# Patient Record
Sex: Female | Born: 1985 | Race: Black or African American | Hispanic: No | Marital: Single | Smoking: Never smoker
Health system: Southern US, Community
[De-identification: ages and names within clinical notes are randomized; demographics above are authoritative.]

## PROBLEM LIST (undated history)

## (undated) DIAGNOSIS — L309 Dermatitis, unspecified: Secondary | ICD-10-CM

## (undated) DIAGNOSIS — F909 Attention-deficit hyperactivity disorder, unspecified type: Secondary | ICD-10-CM

## (undated) DIAGNOSIS — M5430 Sciatica, unspecified side: Secondary | ICD-10-CM

## (undated) DIAGNOSIS — G5600 Carpal tunnel syndrome, unspecified upper limb: Secondary | ICD-10-CM

## (undated) HISTORY — DX: Dermatitis, unspecified: L30.9

## (undated) HISTORY — DX: Sciatica, unspecified side: M54.30

## (undated) HISTORY — DX: Attention-deficit hyperactivity disorder, unspecified type: F90.9

---

## 2008-07-25 HISTORY — PX: CHOLECYSTECTOMY: SHX55

## 2009-05-25 ENCOUNTER — Inpatient Hospital Stay (HOSPITAL_COMMUNITY): Admission: EM | Admit: 2009-05-25 | Discharge: 2009-05-29 | Payer: Self-pay | Admitting: Emergency Medicine

## 2010-10-27 LAB — COMPREHENSIVE METABOLIC PANEL
ALT: 131 U/L — ABNORMAL HIGH (ref 0–35)
ALT: 323 U/L — ABNORMAL HIGH (ref 0–35)
AST: 140 U/L — ABNORMAL HIGH (ref 0–37)
AST: 21 U/L (ref 0–37)
Albumin: 2.7 g/dL — ABNORMAL LOW (ref 3.5–5.2)
Albumin: 2.8 g/dL — ABNORMAL LOW (ref 3.5–5.2)
Albumin: 2.8 g/dL — ABNORMAL LOW (ref 3.5–5.2)
Albumin: 3.1 g/dL — ABNORMAL LOW (ref 3.5–5.2)
BUN: 12 mg/dL (ref 6–23)
BUN: 2 mg/dL — ABNORMAL LOW (ref 6–23)
Calcium: 7.6 mg/dL — ABNORMAL LOW (ref 8.4–10.5)
Chloride: 108 mEq/L (ref 96–112)
Chloride: 109 mEq/L (ref 96–112)
Creatinine, Ser: 0.69 mg/dL (ref 0.4–1.2)
Creatinine, Ser: 0.73 mg/dL (ref 0.4–1.2)
Creatinine, Ser: 0.84 mg/dL (ref 0.4–1.2)
Creatinine, Ser: 0.87 mg/dL (ref 0.4–1.2)
GFR calc Af Amer: >60 mL/min (ref >60)
GFR calc non Af Amer: >60 mL/min (ref >60)
GFR calc non Af Amer: >60 mL/min (ref >60)
GFR calc non Af Amer: >60 mL/min (ref >60)
Glucose, Bld: 73 mg/dL (ref 70–99)
Potassium: 3.4 mEq/L — ABNORMAL LOW (ref 3.5–5.1)
Potassium: 3.5 mEq/L (ref 3.5–5.1)
Total Bilirubin: 0.5 mg/dL (ref 0.3–1.2)
Total Bilirubin: 0.7 mg/dL (ref 0.3–1.2)
Total Bilirubin: 1.1 mg/dL (ref 0.3–1.2)
Total Protein: 6.3 g/dL (ref 6.0–8.3)
Total Protein: 6.3 g/dL (ref 6.0–8.3)

## 2010-10-27 LAB — HEPATIC FUNCTION PANEL: Albumin: 3.4 g/dL — ABNORMAL LOW (ref 3.5–5.2)

## 2010-10-27 LAB — URINALYSIS, ROUTINE W REFLEX MICROSCOPIC
Glucose, UA: NEGATIVE mg/dL
Leukocytes, UA: NEGATIVE
Specific Gravity, Urine: 1.016 (ref 1.005–1.030)
Urobilinogen, UA: 1 mg/dL (ref 0.0–1.0)

## 2010-10-27 LAB — HEPATITIS PANEL, ACUTE
Hep B C IgM: NEGATIVE
Hepatitis B Surface Ag: NEGATIVE

## 2010-10-27 LAB — DIFFERENTIAL
Basophils Absolute: 0 10*3/uL (ref 0.0–0.1)
Eosinophils Absolute: 0 10*3/uL (ref 0.0–0.7)
Eosinophils Relative: 0 % (ref 0–5)
Monocytes Absolute: 0.8 10*3/uL (ref 0.1–1.0)
Monocytes Relative: 9 % (ref 3–12)
Neutrophils Relative %: 76 % (ref 43–77)

## 2010-10-27 LAB — CBC
HCT: 34.8 % — ABNORMAL LOW (ref 36.0–46.0)
HCT: 35.8 % — ABNORMAL LOW (ref 36.0–46.0)
Hemoglobin: 11.6 g/dL — ABNORMAL LOW (ref 12.0–15.0)
Hemoglobin: 12.3 g/dL (ref 12.0–15.0)
MCHC: 34.5 g/dL (ref 30.0–36.0)
MCHC: 34.5 g/dL (ref 30.0–36.0)
MCV: 87.6 fL (ref 78.0–100.0)
MCV: 88.1 fL (ref 78.0–100.0)
Platelets: 136 10*3/uL — ABNORMAL LOW (ref 150–400)
Platelets: 152 10*3/uL (ref 150–400)
RBC: 4.07 MIL/uL (ref 3.87–5.11)
RDW: 13.6 % (ref 11.5–15.5)
WBC: 8.6 10*3/uL (ref 4.0–10.5)

## 2010-10-27 LAB — LIPID PANEL
Cholesterol: 146 mg/dL (ref 0–200)
LDL Cholesterol: 88 mg/dL (ref 0–99)
Total CHOL/HDL Ratio: 3.2 RATIO
VLDL: 13 mg/dL (ref 0–40)

## 2010-10-27 LAB — HSV(HERPES SMPLX)ABS-I+II(IGG+IGM)-BLD
Herpes Simplex Vrs I + II Ab, IgG: 34.7 IV — ABNORMAL HIGH
Herpes Simplex Vrs I&II-IgM Ab (EIA): 2.52 INDEX — ABNORMAL HIGH

## 2010-10-27 LAB — URINE MICROSCOPIC-ADD ON

## 2010-10-27 LAB — GLUCOSE, CAPILLARY: Glucose-Capillary: 98 mg/dL (ref 70–99)

## 2010-10-27 LAB — CMV ABS, IGG+IGM (CYTOMEGALOVIRUS)
CMV IgM: <8 AU/mL (ref <30.0)
Cytomegalovirus Ab-IgG: 8.3 IU/mL — ABNORMAL HIGH (ref <0.4)

## 2010-10-27 LAB — TSH: TSH: 0.228 u[IU]/mL — ABNORMAL LOW (ref 0.350–4.500)

## 2010-10-27 LAB — SEDIMENTATION RATE: Sed Rate: 35 mm/hr — ABNORMAL HIGH (ref 0–22)

## 2010-10-27 LAB — BASIC METABOLIC PANEL
Calcium: 7.9 mg/dL — ABNORMAL LOW (ref 8.4–10.5)
GFR calc non Af Amer: >60 mL/min (ref >60)
Glucose, Bld: 110 mg/dL — ABNORMAL HIGH (ref 70–99)
Sodium: 132 mEq/L — ABNORMAL LOW (ref 135–145)

## 2010-10-27 LAB — T4, FREE: Free T4: 0.79 ng/dL — ABNORMAL LOW (ref 0.80–1.80)

## 2010-10-27 LAB — EPSTEIN-BARR VIRUS VCA ANTIBODY PANEL
EBV EA IgG: 1.31 {ISR} — ABNORMAL HIGH
EBV VCA IgM: 0.11 {ISR}

## 2010-10-27 LAB — CK TOTAL AND CKMB (NOT AT ARMC): Total CK: 187 U/L — ABNORMAL HIGH (ref 7–177)

## 2010-10-27 LAB — LIPASE, BLOOD: Lipase: 306 U/L — ABNORMAL HIGH (ref 11–59)

## 2010-10-28 LAB — URINE CULTURE

## 2010-10-28 LAB — URINALYSIS, ROUTINE W REFLEX MICROSCOPIC
Nitrite: NEGATIVE
Specific Gravity, Urine: 1.026 (ref 1.005–1.030)
pH: 7 (ref 5.0–8.0)

## 2010-10-28 LAB — COMPREHENSIVE METABOLIC PANEL
ALT: 506 U/L — ABNORMAL HIGH (ref 0–35)
AST: 426 U/L — ABNORMAL HIGH (ref 0–37)
Albumin: 3.9 g/dL (ref 3.5–5.2)
CO2: 22 mEq/L (ref 19–32)
Calcium: 8.7 mg/dL (ref 8.4–10.5)
GFR calc Af Amer: >60 mL/min (ref >60)
Sodium: 135 mEq/L (ref 135–145)
Total Protein: 7.6 g/dL (ref 6.0–8.3)

## 2010-10-28 LAB — CBC
MCHC: 34.2 g/dL (ref 30.0–36.0)
Platelets: 156 10*3/uL (ref 150–400)
RBC: 4.61 MIL/uL (ref 3.87–5.11)
RDW: 13.5 % (ref 11.5–15.5)

## 2010-10-28 LAB — DIFFERENTIAL
Eosinophils Absolute: 0 10*3/uL (ref 0.0–0.7)
Eosinophils Relative: 0 % (ref 0–5)
Lymphs Abs: 1.5 10*3/uL (ref 0.7–4.0)
Monocytes Absolute: 0.7 10*3/uL (ref 0.1–1.0)
Monocytes Relative: 8 % (ref 3–12)

## 2010-10-28 LAB — URINE MICROSCOPIC-ADD ON

## 2012-05-08 ENCOUNTER — Institutional Professional Consult (permissible substitution): Payer: Medicaid Other | Admitting: Pulmonary Disease

## 2016-01-28 ENCOUNTER — Emergency Department (HOSPITAL_COMMUNITY)
Admission: EM | Admit: 2016-01-28 | Discharge: 2016-01-28 | Disposition: A | Discharge status: Home / Self Care | Payer: Medicaid Other | Attending: Emergency Medicine | Admitting: Emergency Medicine

## 2016-01-28 ENCOUNTER — Encounter (HOSPITAL_COMMUNITY): Payer: Self-pay | Admitting: Emergency Medicine

## 2016-01-28 DIAGNOSIS — M5441 Lumbago with sciatica, right side: Secondary | ICD-10-CM | POA: Insufficient documentation

## 2016-01-28 MED ORDER — IBUPROFEN 600 MG PO TABS: 600.0000 mg | ORAL_TABLET | Freq: Three times a day (TID) | ORAL | Status: DC

## 2016-01-28 MED ORDER — PREDNISONE 20 MG PO TABS: 40.0000 mg | ORAL_TABLET | Freq: Every day | ORAL | Status: DC

## 2016-01-28 MED ORDER — KETOROLAC TROMETHAMINE 60 MG/2ML IM SOLN
60.0000 mg | Freq: Once | INTRAMUSCULAR | Status: AC
  Administered 2016-01-28: 60 mg via INTRAMUSCULAR
  Filled 2016-01-28: qty 2

## 2016-01-28 MED ORDER — METHOCARBAMOL 500 MG PO TABS: 500.0000 mg | ORAL_TABLET | Freq: Every evening | ORAL | Status: DC | PRN

## 2016-01-28 NOTE — ED Notes (Signed)
Low back pain, radiates into left buttock area, hurts to sit, move. Took niece and nephew to pool on 4th and 5th and was playing with them, pain got worse over night.

## 2016-01-28 NOTE — ED Provider Notes (Signed)
CSN: [id number]     Arrival date & time 01/28/16  1422 History  By signing my name below, I, Vanessa Bennett, attest that this documentation has been prepared under the direction and in the presence of non-physician practitioner, Terance HartKelly Robertson Colclough, PA-C. Electronically Signed: Freida Busmaniana Bennett, Scribe. 01/28/2016. 3:19 PM.    Chief Complaint  Patient presents with  . Back Pain    The history is provided by the patient. No language interpreter was used.    HPI Comments:  Vanessa Bennett is a 30 y.o. female with no significant PMH who presents to the Emergency Department complaining of gradual onset, moderate, mid to lower back pain which began ~1600 yesterday. Her pain is exacerbated with movement. She notes radiation of pain into left buttocks. She denies recent injury/fall.  She has taken tylenol without relief. She notes a hot shower and massage makes it better. She denies abdominal pain and neck pain, fever, bowel/bladder incontinence, numbness to groin area and h/o IVDA.   History reviewed. No pertinent past medical history. Past Surgical History  Procedure Laterality Date  . Cholecystectomy  2010   No family history on file. Social History  Substance Use Topics  . Smoking status: Never Smoker   . Smokeless tobacco: None  . Alcohol Use: No   OB History    No data available     Review of Systems  Gastrointestinal: Negative for abdominal pain.  Musculoskeletal: Positive for back pain. Negative for neck pain.  Neurological: Negative for weakness and numbness.   Allergies  Review of patient's allergies indicates not on file.  Home Medications   Prior to Admission medications   Not on File   BP 137/90 mmHg  Pulse 71  Temp(Src) 98 F (36.7 C) (Oral)  Resp 20  SpO2 99%  LMP 01/08/2016 (Exact Date)   Physical Exam  Constitutional: She is oriented to person, place, and time. She appears well-developed and well-nourished. No distress.  Obese  HENT:  Head: Normocephalic and  atraumatic.  Eyes: Conjunctivae are normal. Pupils are equal, round, and reactive to light. Right eye exhibits no discharge. Left eye exhibits no discharge. No scleral icterus.  Neck: Normal range of motion.  Cardiovascular: Normal rate.   Pulmonary/Chest: Effort normal. No respiratory distress.  Abdominal: She exhibits no distension.  Musculoskeletal:  Inspection: No masses, deformity, or rash Palpation: No midline spinal tenderness. Right lumbar paraspinal muscle tenderness. ROM: Deferred due to pain Strength: 5/5 in lower extremities and normal plantar and dorsiflexion Sensation: Intact sensation with light touch in lower extremities bilaterally Gait: Antalgic gait Reflexes: Patellar reflex is 2+ bilaterally, Achilles is 2+ bilaterally SLR: Positive seated straight leg raise on right side  Neurological: She is alert and oriented to person, place, and time.  Skin: Skin is warm and dry.  Psychiatric: She has a normal mood and affect. Her behavior is normal.    ED Course  Procedures  DIAGNOSTIC STUDIES:  Oxygen Saturation is 99% on RA, normal by my interpretation.    COORDINATION OF CARE:  3:13 PM Will discharge with steroids, anti-inflammatories, and muscle relaxer. Discussed treatment plan with pt at bedside and pt agreed to plan.   MDM   Final diagnoses:  Right-sided low back pain with right-sided sciatica   Patient with back pain.  No neurological deficits and normal neuro exam.  Patient is ambulatory.  No loss of bowel or bladder control.  No concern for cauda equina.  No fever, night sweats, weight loss, h/o cancer, IVDA, no recent procedure  to back. No urinary symptoms suggestive of UTI.  Rx for motrin, robaxin, and prednisone given. Supportive care and return precaution discussed. Appears safe for discharge at this time. Follow up as indicated in discharge paperwork.   I personally performed the services described in this documentation, which was scribed in my presence.  The recorded information has been reviewed and is accurate.    Vanessa BornKelly Marie Stevey Stapleton, PA-C 01/29/16 [id number]  Vanessa HutchingBrian Cook, MD 01/29/16 [phone]

## 2018-01-29 ENCOUNTER — Emergency Department (HOSPITAL_COMMUNITY): Payer: No Typology Code available for payment source

## 2018-01-29 ENCOUNTER — Other Ambulatory Visit: Payer: Self-pay

## 2018-01-29 ENCOUNTER — Encounter (HOSPITAL_COMMUNITY): Payer: Self-pay

## 2018-01-29 ENCOUNTER — Emergency Department (HOSPITAL_COMMUNITY)
Admission: EM | Admit: 2018-01-29 | Discharge: 2018-01-29 | Disposition: A | Discharge status: Home / Self Care | Payer: No Typology Code available for payment source | Attending: Emergency Medicine | Admitting: Emergency Medicine

## 2018-01-29 DIAGNOSIS — Y998 Other external cause status: Secondary | ICD-10-CM | POA: Diagnosis not present

## 2018-01-29 DIAGNOSIS — Y9389 Activity, other specified: Secondary | ICD-10-CM | POA: Insufficient documentation

## 2018-01-29 DIAGNOSIS — Y92410 Unspecified street and highway as the place of occurrence of the external cause: Secondary | ICD-10-CM | POA: Insufficient documentation

## 2018-01-29 DIAGNOSIS — F1210 Cannabis abuse, uncomplicated: Secondary | ICD-10-CM | POA: Diagnosis not present

## 2018-01-29 DIAGNOSIS — M545 Low back pain: Secondary | ICD-10-CM | POA: Diagnosis present

## 2018-01-29 HISTORY — DX: Carpal tunnel syndrome, unspecified upper limb: G56.00

## 2018-01-29 MED ORDER — NAPROXEN 375 MG PO TABS: 375.0000 mg | ORAL_TABLET | Freq: Two times a day (BID) | ORAL | 0 refills | Status: DC

## 2018-01-29 MED ORDER — METHOCARBAMOL 500 MG PO TABS: 500.0000 mg | ORAL_TABLET | Freq: Three times a day (TID) | ORAL | 0 refills | Status: DC | PRN

## 2018-01-29 MED ORDER — ACETAMINOPHEN 325 MG PO TABS
650.0000 mg | ORAL_TABLET | Freq: Once | ORAL | Status: AC
  Administered 2018-01-29: 650 mg via ORAL
  Filled 2018-01-29: qty 2
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
## 2018-01-29 NOTE — ED Notes (Signed)
Patient transported to X-ray 
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
## 2022-08-23 ENCOUNTER — Encounter (HOSPITAL_BASED_OUTPATIENT_CLINIC_OR_DEPARTMENT_OTHER): Payer: Self-pay | Admitting: Emergency Medicine

## 2022-08-23 ENCOUNTER — Emergency Department (HOSPITAL_BASED_OUTPATIENT_CLINIC_OR_DEPARTMENT_OTHER)
Admission: EM | Admit: 2022-08-23 | Discharge: 2022-08-23 | Disposition: A | Discharge status: Home / Self Care | Payer: 59 | Attending: Emergency Medicine | Admitting: Emergency Medicine

## 2022-08-23 ENCOUNTER — Other Ambulatory Visit: Payer: Self-pay

## 2022-08-23 DIAGNOSIS — M5441 Lumbago with sciatica, right side: Secondary | ICD-10-CM | POA: Diagnosis not present

## 2022-08-23 DIAGNOSIS — M545 Low back pain, unspecified: Secondary | ICD-10-CM | POA: Diagnosis present

## 2022-08-23 DIAGNOSIS — M5431 Sciatica, right side: Secondary | ICD-10-CM

## 2022-08-23 MED ORDER — PREDNISONE 20 MG PO TABS: 60.0000 mg | ORAL_TABLET | Freq: Every day | ORAL | 0 refills | Status: AC

## 2022-08-23 NOTE — ED Triage Notes (Signed)
Pt arrives to ED with c/o lower back pain with radiation down right leg x2 weeks.

## 2022-08-23 NOTE — Discharge Instructions (Addendum)
Take Tylenol every 4 hours needed for pain in addition to ice or heat. Take steroids as directed for 5 days.  I would avoid ibuprofen during this time as it is hard on your stomach. Return if you develop weakness, fevers, incontinence or new concerns. Follow-up with primary doctor and/or sports medicine as you may need further workup or testing.  Work note provided.

## 2022-08-23 NOTE — ED Provider Notes (Signed)
[facility] Provider Note   CSN: [id number] Arrival date & time: 08/23/22  3536     History  Chief Complaint  Patient presents with   Back Pain    Vanessa Bennett is a 37 y.o. female.  Patient's had intermittent gradually worsening pain down the right leg worse with position and movement.  This been going on for almost 2 weeks.  Patient denies any bowel or bladder change, weakness or numbness.  Difficulty with walking due to pain.  Patient denies any blood clot history, no leg swelling, starts in the right upper buttock area and goes down the back and then side of the right leg.  No fevers or chills or injury.  Patient tried muscle relaxant without significant improvement.  Patient does not have primary doctor but is currently try to get 1.       Home Medications Prior to Admission medications   Medication Sig Start Date End Date Taking? Authorizing Provider  predniSONE (DELTASONE) 20 MG tablet Take 3 tablets (60 mg total) by mouth daily with breakfast for 5 days. 08/23/22 08/28/22 Yes Elnora Morrison, MD  methocarbamol (ROBAXIN) 500 MG tablet Take 1 tablet (500 mg total) by mouth every 8 (eight) hours as needed. 01/29/18   Petrucelli, Samantha R, PA-C  naproxen (NAPROSYN) 375 MG tablet Take 1 tablet (375 mg total) by mouth 2 (two) times daily. 01/29/18   Petrucelli, Samantha R, PA-C      Allergies    Penicillins    Review of Systems   Review of Systems  Constitutional:  Negative for chills and fever.  HENT:  Negative for congestion.   Eyes:  Negative for visual disturbance.  Respiratory:  Negative for shortness of breath.   Cardiovascular:  Negative for chest pain.  Gastrointestinal:  Negative for abdominal pain and vomiting.  Genitourinary:  Negative for dysuria and flank pain.  Musculoskeletal:  Positive for back pain and gait problem. Negative for neck pain and neck stiffness.  Skin:  Negative for rash.  Neurological:  Negative for  light-headedness and headaches.    Physical Exam Updated Vital Signs Ht 5\' 7"  (1.702 m)   Wt 105.4 kg   BMI 36.39 kg/m  Physical Exam Vitals and nursing note reviewed.  Constitutional:      General: She is not in acute distress.    Appearance: She is well-developed.  HENT:     Head: Normocephalic and atraumatic.     Mouth/Throat:     Mouth: Mucous membranes are moist.  Eyes:     General:        Right eye: No discharge.        Left eye: No discharge.     Conjunctiva/sclera: Conjunctivae normal.  Neck:     Trachea: No tracheal deviation.  Cardiovascular:     Rate and Rhythm: Normal rate.     Heart sounds: No murmur heard. Pulmonary:     Effort: Pulmonary effort is normal.     Breath sounds: Normal breath sounds.  Abdominal:     General: There is no distension.     Palpations: Abdomen is soft.     Tenderness: There is no abdominal tenderness. There is no guarding.  Musculoskeletal:        General: Tenderness present. No swelling.     Cervical back: Normal range of motion.     Comments: Patient has normal strength of flexion extension of hip knee and ankles.  No foot drop.  Neurovascular intact right  leg no edema.  Pain with flexion of the hip and specific movements with walking cautious gait.  Skin:    General: Skin is warm.     Capillary Refill: Capillary refill takes less than 2 seconds.     Findings: No rash.  Neurological:     General: No focal deficit present.     Mental Status: She is alert.     Sensory: No sensory deficit.     Motor: No weakness.  Psychiatric:        Mood and Affect: Mood normal.     ED Results / Procedures / Treatments   Labs (all labs ordered are listed, but only abnormal results are displayed) Labs Reviewed - No data to display  EKG None  Radiology No results found.  Procedures Procedures    Medications Ordered in ED Medications - No data to display  ED Course/ Medical Decision Making/ A&P                              Medical Decision Making Risk Prescription drug management.   Patient presents with clinical concern for sciatica on the right.  No neurologic deficits and pain mild to moderate at this time.  Discussed importance of primary doctor, sports medicine follow-up and strict reasons to return such as weakness or bowel or bladder changes. No concern for infectious etiology at this time.  Vital signs pending and requested.  Prednisone for 5 days and outpatient follow-up.        Final Clinical Impression(s) / ED Diagnoses Final diagnoses:  Sciatica of right side    Rx / DC Orders ED Discharge Orders          Ordered    predniSONE (DELTASONE) 20 MG tablet  Daily with breakfast        08/23/22 1100              Elnora Morrison, MD 08/23/22 1103

## 2022-08-31 ENCOUNTER — Ambulatory Visit (INDEPENDENT_AMBULATORY_CARE_PROVIDER_SITE_OTHER): Payer: 59 | Admitting: Sports Medicine

## 2022-08-31 VITALS — BP 132/88 | Ht 67.0 in | Wt 232.0 lb

## 2022-08-31 DIAGNOSIS — M5441 Lumbago with sciatica, right side: Secondary | ICD-10-CM

## 2022-08-31 MED ORDER — NAPROXEN 375 MG PO TABS: 375.0000 mg | ORAL_TABLET | Freq: Two times a day (BID) | ORAL | 0 refills | Status: DC

## 2022-08-31 NOTE — Progress Notes (Signed)
   New Patient Office Visit  Subjective   Patient ID: Vanessa Bennett, female    DOB: Dec 09, 1985  Age: 37 y.o. MRN: [id number]  Low back pain with radiation down leg.  Ms. Bloodsaw is here today for ER follow-up for back pain.  She was seen about 1 week ago in the ER with chief complaint of back pain that been bothering her for about 2 weeks and was radiating down her leg.  She has had back pain in the past however not quite as severe as she was having it last week.  At that time she was diagnosed with sciatica and prescribed 5-day prednisone course.  She reports she is much better after taking the steroid as she was having difficulty ambulating at the time of her ER evaluation. She denies any numbness or tingling radiating down her leg, weakness or loss of bowel or bladder function.  She works a very active job at [employer].   ROS as listed above in HPI    Objective:     BP 132/88   Ht 5\' 7"  (1.702 m)   Wt 232 lb (105.2 kg)   BMI 36.34 kg/m   Physical Exam Vitals reviewed.  Constitutional:      General: She is not in acute distress.    Appearance: Normal appearance. She is obese. She is not ill-appearing, toxic-appearing or diaphoretic.  Pulmonary:     Effort: Pulmonary effort is normal.  Neurological:     Mental Status: She is alert.   Seated comfortably in exam room.  Low back: No obvious deformity or asymmetry. Tenderness to palpation R glute muscles and PSIS.  No tenderness to palpation over her greater trochanter.  Negative seated straight leg raise testing bilaterally. Sensation to light touch intact.  Strength 5/5 resisted knee and hip flexion. Resisted plantarflexion dorsiflexion of the bilateral ankles 5/5.  Reflexes patellar and Achilles 2/4 bilaterally. Normal gait.    Assessment & Plan:   Problem List Items Addressed This Visit       Nervous and Auditory   Acute right-sided low back pain with right-sided sciatica - Primary    Patient was seen in the ER and given a  5-day prednisone course with some improvement in her symptoms.  She was also recommended follow-up with sports medicine and primary care provider. Symptoms today most concerning for sciatica.  Will send her to some formal physical therapy for couple of visits and then transition to home program.  I have also sent to her pharmacy naproxen, she has done well with this medication in the past for her to use for the next 7 to 10 days until her symptoms have resolved.  If she has no improvement with physical therapy we can consider further imaging, MRI, however anticipate she will do very well. Follow-up if no improvement of her symptoms after 6 weeks.      Relevant Medications   naproxen (NAPROSYN) 375 MG tablet    Return if symptoms worsen or fail to improve.    Elmore Guise, DO  Addendum:  I was the preceptor for this visit and available for immediate consultation.  Karlton Lemon MD Kirt Boys

## 2022-08-31 NOTE — Assessment & Plan Note (Addendum)
Patient was seen in the ER and given a 5-day prednisone course with some improvement in her symptoms.  She was also recommended follow-up with sports medicine and primary care provider. Symptoms today most concerning for sciatica.  Will send her to some formal physical therapy for couple of visits and then transition to home program.  I have also sent to her pharmacy naproxen, she has done well with this medication in the past for her to use for the next 7 to 10 days until her symptoms have resolved.  If she has no improvement with physical therapy we can consider further imaging, MRI, however anticipate she will do very well. Follow-up if no improvement of her symptoms after 6 weeks.

## 2022-08-31 NOTE — Patient Instructions (Signed)
Your symptoms today are most consistent with sciatica.  I recommend couple sessions of physical therapy and transition to home program.  Room to do these exercises until you are pain-free and then as a maintenance program a few times a week.  While you are at physical therapy I recommend you discuss with them appropriate lifting mechanics as you do this frequently at work.  I have also sent to your pharmacy some naproxen to use for the next 7 to 10 days.  You may then transition to using the naproxen as needed.  Follow-up with [place] in 6 weeks if your symptoms have not resolved or they have worsened.

## 2022-09-12 ENCOUNTER — Ambulatory Visit: Payer: 59 | Admitting: Student

## 2022-09-12 ENCOUNTER — Other Ambulatory Visit: Payer: Self-pay

## 2022-09-12 ENCOUNTER — Encounter: Payer: Self-pay | Admitting: Student

## 2022-09-12 VITALS — BP 111/85 | HR 79 | Temp 98.5°F | Resp 24 | Ht 67.0 in | Wt 229.5 lb

## 2022-09-12 DIAGNOSIS — Z0000 Encounter for general adult medical examination without abnormal findings: Secondary | ICD-10-CM

## 2022-09-12 DIAGNOSIS — R946 Abnormal results of thyroid function studies: Secondary | ICD-10-CM | POA: Diagnosis not present

## 2022-09-12 DIAGNOSIS — Z87898 Personal history of other specified conditions: Secondary | ICD-10-CM | POA: Diagnosis not present

## 2022-09-12 NOTE — Patient Instructions (Signed)
Thank you so much for coming to the clinic today!   Welcome to the clinic! We are checking your thyroid tests today as well as your sugar level. If there is anything abnormal I will give you a call. If y ou need anything else please call the clinic whenever!  If you have any questions please feel free to the call the clinic at anytime at [phone]. It was a pleasure seeing you!  Best, Dr. Sanjuana Mae

## 2022-09-13 LAB — TSH: TSH: 0.426 u[IU]/mL — ABNORMAL LOW (ref 0.450–4.500)

## 2022-09-13 LAB — HEMOGLOBIN A1C
Est. average glucose Bld gHb Est-mCnc: 131 mg/dL
Hgb A1c MFr Bld: 6.2 % — ABNORMAL HIGH (ref 4.8–5.6)

## 2022-09-13 LAB — HIV ANTIBODY (ROUTINE TESTING W REFLEX): HIV Screen 4th Generation wRfx: NONREACTIVE

## 2022-09-13 NOTE — Progress Notes (Addendum)
CC: New patient establishing  HPI:  Ms.Vanessa Bennett is a 37 y.o. female living with a history stated below and presents today for new patient establishing. Please see problem based assessment and plan for additional details.  Past Medical History:  Diagnosis Date   ADHD    Carpal tunnel syndrome    Eczema    Sciatica     Current Outpatient Medications on File Prior to Visit  Medication Sig Dispense Refill   cetirizine (ZYRTEC) 5 MG chewable tablet Chew 5 mg by mouth daily.     No current facility-administered medications on file prior to visit.    Family History  Problem Relation Age of Onset   Sleep apnea Mother    Hypertension Father     Social History   Socioeconomic History   Marital status: Single    Spouse name: Not on file   Number of children: Not on file   Years of education: Not on file   Highest education level: Not on file  Occupational History   Occupation: Scientific laboratory technician  Tobacco Use   Smoking status: Never   Smokeless tobacco: Not on file   Tobacco comments:    Smokes Marijuana   Substance and Sexual Activity   Alcohol use: Yes    Comment: rare   Drug use: Yes    Types: Marijuana    Comment: Every now and then   Sexual activity: Not Currently  Other Topics Concern   Not on file  Social History Narrative   Not on file   Social Determinants of Health   Financial Resource Strain: Not on file  Food Insecurity: No Food Insecurity (09/12/2022)   Hunger Vital Sign    Worried About Running Out of Food in the Last Year: Never true    Ran Out of Food in the Last Year: Never true  Transportation Needs: No Transportation Needs (09/12/2022)   PRAPARE - Hydrologist (Medical): No    Lack of Transportation (Non-Medical): No  Physical Activity: Not on file  Stress: Not on file  Social Connections: Moderately Isolated (09/12/2022)   Social Connection and Isolation Panel [NHANES]    Frequency of Communication with Friends  and Family: More than three times a week    Frequency of Social Gatherings with Friends and Family: More than three times a week    Attends Religious Services: 1 to 4 times per year    Active Member of [employer] or Organizations: No    Attends Archivist Meetings: Never    Marital Status: Never married  Intimate Partner Violence: Not At Risk (09/12/2022)   Humiliation, Afraid, Rape, and Kick questionnaire    Fear of Current or Ex-Partner: No    Emotionally Abused: No    Physically Abused: No    Sexually Abused: No    Review of Systems: ROS negative except for what is noted on the assessment and plan.  Vitals:   09/12/22 1026  BP: 111/85  Pulse: 79  Resp: (!) 24  Temp: 98.5 F (36.9 C)  TempSrc: Oral  SpO2: 100%  Weight: 229 lb 8 oz (104.1 kg)  Height: 5' 7"$  (1.702 m)    Physical Exam: Constitutional: Obese woman,  in no acute distress HENT: normocephalic atraumatic, mucous membranes moist Eyes: conjunctiva non-erythematous Neck: supple Cardiovascular: regular rate and rhythm, no m/r/g Pulmonary/Chest: normal work of breathing on room air, lungs clear to auscultation bilaterally Abdominal: soft, non-tender, non-distended MSK: normal bulk and tone  Neurological: alert & oriented x 3, 5/5 strength in bilateral upper and lower extremities, normal gait Skin: warm and dry Psych: Normal mood and affect  Assessment & Plan:   History of prediabetes Patient states that her previous primary care provider, she was at risk for prediabetes.  She denies any polyuria or polydipsia.  She has never taken medications for glycemic control before.  Will obtain an A1c to further evaluate.   Addendum: A1c at 6.2, encouraged patient to continue diet and exercise  Thyroid function test abnormal Patient reports previous primary care provider says she has some sort of abnormal thyroid levels.  Unable to palpate any masses on thyroid exam, and patient denies any symptoms of hyper or  hypothyroidism.  Will obtain TSH for further evaluation if necessary for intervention.  Healthcare maintenance Patient states her last Pap smear was last year no acute complaints at this moment.  She denied flu shot and Tdap vaccine.  She does say that she is COVID vaccinated when the initial COVID vaccines came out, however has not had a booster.  Patient has had hep C screening before which was negative, but has never had HIV screening, will test today.  Patient discussed with Dr. Thomasene Ripple, M.D. [facility], PGY-1 Phone: [phone] Date 09/13/2022 Time 4:27 PM

## 2022-09-13 NOTE — Assessment & Plan Note (Signed)
Patient reports previous primary care provider says she has some sort of abnormal thyroid levels.  Unable to palpate any masses on thyroid exam, and patient denies any symptoms of hyper or hypothyroidism.  Will obtain TSH for further evaluation if necessary for intervention.

## 2022-09-13 NOTE — Assessment & Plan Note (Addendum)
Patient states that her previous primary care provider, she was at risk for prediabetes.  She denies any polyuria or polydipsia.  She has never taken medications for glycemic control before.  Will obtain an A1c to further evaluate.   Addendum: A1c at 6.2, encouraged patient to continue diet and exercise

## 2022-09-13 NOTE — Assessment & Plan Note (Addendum)
Patient states her last Pap smear was last year no acute complaints at this moment.  She denied flu shot and Tdap vaccine.  She does say that she is COVID vaccinated when the initial COVID vaccines came out, however has not had a booster.  Patient has had hep C screening before which was negative, but has never had HIV screening, will test today.

## 2022-09-16 NOTE — Progress Notes (Signed)
Internal Medicine Clinic Attending  Case discussed with the resident at the time of the visit.  We reviewed the resident's history and exam and pertinent patient test results.  I agree with the assessment, diagnosis, and plan of care documented in the resident's note.  

## 2024-02-26 ENCOUNTER — Other Ambulatory Visit: Payer: Self-pay

## 2024-02-26 ENCOUNTER — Emergency Department (HOSPITAL_BASED_OUTPATIENT_CLINIC_OR_DEPARTMENT_OTHER)
Admission: EM | Admit: 2024-02-26 | Discharge: 2024-02-26 | Disposition: A | Discharge status: Home / Self Care | Attending: Emergency Medicine | Admitting: Emergency Medicine

## 2024-02-26 DIAGNOSIS — M545 Low back pain, unspecified: Secondary | ICD-10-CM | POA: Diagnosis present

## 2024-02-26 DIAGNOSIS — M5441 Lumbago with sciatica, right side: Secondary | ICD-10-CM | POA: Diagnosis not present

## 2024-02-26 DIAGNOSIS — M5431 Sciatica, right side: Secondary | ICD-10-CM

## 2024-02-26 MED ORDER — NAPROXEN 500 MG PO TABS: 500.0000 mg | ORAL_TABLET | Freq: Two times a day (BID) | ORAL | 0 refills | Status: AC | PRN

## 2024-02-26 MED ORDER — METHYLPREDNISOLONE SODIUM SUCC 40 MG IJ SOLR
40.0000 mg | Freq: Once | INTRAMUSCULAR | Status: AC
  Administered 2024-02-26: 40 mg via INTRAMUSCULAR
  Filled 2024-02-26: qty 1

## 2024-02-26 MED ORDER — PREDNISONE 50 MG PO TABS: 50.0000 mg | ORAL_TABLET | Freq: Every day | ORAL | 0 refills | Status: AC

## 2024-02-26 NOTE — ED Notes (Signed)
 Pt given discharge instructions and reviewed prescriptions. Opportunities given for questions. Pt verbalizes understanding. Jillyn Hidden, RN

## 2024-02-26 NOTE — ED Triage Notes (Signed)
 C/o lower back pain that rads down left leg. Hx of sciatica.

## 2024-02-26 NOTE — Discharge Instructions (Addendum)
 Please take 50 mg of prednisone  daily for the next 5 days  After completing this course, you can use naproxen  twice daily as needed for pain  I recommend following up with your primary care doctor and/or sports medicine doctor. You may benefit from additional physical therapy. See attached information about some exercises you can do at home.  Please seek medical attention if you have any fevers, numbness or weakness in your legs, loss of control of your bowel or bladder, or loss of sensation

## 2024-02-26 NOTE — ED Provider Notes (Addendum)
 [place] EMERGENCY DEPARTMENT AT [facility] Provider Note   CSN: [id number] Arrival date & time: 02/26/24  9189     Patient presents with: Back Pain   Vanessa Bennett is a 38 y.o. female.   Low back pain, radiating into R leg Ongoing for a couple of weeks but worsening over the past 4-5 days This morning, was having trouble standing up and walking due to the pain Denies any numbness/weakness in her extremities, saddle anesthesia, bowel/bladder incontinence, fevers. Denies hx of back surgery Denies hx of injuries, car accidents, heavy lifting   Back Pain Associated symptoms: no abdominal pain, no chest pain, no dysuria, no fever, no headaches, no numbness and no weakness        Prior to Admission medications   Medication Sig Start Date End Date Taking? Authorizing Provider  cetirizine (ZYRTEC) 5 MG chewable tablet Chew 5 mg by mouth daily.    [provider]  naproxen  (NAPROSYN ) 500 MG tablet Take 1 tablet (500 mg total) by mouth 2 (two) times daily as needed (pain). 02/26/24  Yes Romelle Booty, MD  predniSONE  (DELTASONE ) 50 MG tablet Take 1 tablet (50 mg total) by mouth daily with breakfast. For 5 days 02/26/24  Yes Romelle Booty, MD    Allergies: Penicillins    Review of Systems  Constitutional:  Negative for fever.  Respiratory:  Negative for shortness of breath.   Cardiovascular:  Negative for chest pain and palpitations.  Gastrointestinal:  Negative for abdominal pain.  Genitourinary:  Negative for difficulty urinating and dysuria.  Musculoskeletal:  Positive for back pain. Negative for neck pain.  Neurological:  Negative for dizziness, weakness, numbness and headaches.    Updated Vital Signs BP (!) 132/93 (BP Location: Right Arm)   Pulse 84   Temp 98.9 F (37.2 C) (Oral)   Resp 18   SpO2 99%   Physical Exam Constitutional:      General: She is not in acute distress.    Appearance: Normal appearance.  HENT:     Head: Normocephalic and  atraumatic.  Cardiovascular:     Rate and Rhythm: Normal rate and regular rhythm.     Heart sounds: Normal heart sounds. No murmur heard. Pulmonary:     Effort: Pulmonary effort is normal. No respiratory distress.     Breath sounds: Normal breath sounds.  Abdominal:     General: There is no distension.     Palpations: Abdomen is soft.     Tenderness: There is no abdominal tenderness.  Musculoskeletal:     Cervical back: Neck supple. No rigidity or tenderness.     Comments: No gross deformity, ecchymosis, swelling Mildly tender to palpation over right lower lumbar paraspinal muscles.  No significant bony tenderness over lumbar spine.  Nontender throughout RLE ROM of RLE restricted due to pain but has FROM with assistance Positive straight leg test on right with shooting pain along the back of her leg into the bottom of right foot 5/5 strength in RLE with normal sensation  Skin:    Findings: No rash.  Neurological:     General: No focal deficit present.     Mental Status: She is alert. Mental status is at baseline.     Sensory: No sensory deficit.     Motor: No weakness.     (all labs ordered are listed, but only abnormal results are displayed) Labs Reviewed - No data to display  EKG: None  Radiology: No results found.   Procedures   Medications  Ordered in the ED  methylPREDNISolone  sodium succinate (SOLU-MEDROL ) 40 mg/mL injection 40 mg (has no administration in time range)                                    Medical Decision Making 38 year old female with history of right-sided sciatica presenting due to radicular pain likely 2/2 flare of sciatica No red flags on history or exam to suggest acute spinal cord compression or any infectious etiology Patient was previously seen for this around a year and a half ago and her symptoms improved significantly with oral steroids  Given solumedrol IM injection today Treat with prednisone  50 mg daily x 5 days starting  tomorrow Naproxen  as needed after completing steroid course Recommend follow-up with PCP and/or sports medicine Discussed return precautions and provided handout with rehab exercises  Risk Prescription drug management.        Final diagnoses:  Sciatica of right side    ED Discharge Orders          Ordered    predniSONE  (DELTASONE ) 50 MG tablet  Daily with breakfast        02/26/24 0910    naproxen  (NAPROSYN ) 500 MG tablet  2 times daily PRN        02/26/24 0910               Romelle Booty, MD 02/26/24 9087    Romelle Booty, MD 02/26/24 9064    Romelle Booty, MD 02/26/24 9064    Doretha Folks, MD 02/26/24 [phone]

## 2024-02-29 ENCOUNTER — Other Ambulatory Visit (HOSPITAL_BASED_OUTPATIENT_CLINIC_OR_DEPARTMENT_OTHER): Payer: Self-pay

## 2024-02-29 ENCOUNTER — Emergency Department (HOSPITAL_BASED_OUTPATIENT_CLINIC_OR_DEPARTMENT_OTHER)
Admission: EM | Admit: 2024-02-29 | Discharge: 2024-02-29 | Disposition: A | Discharge status: Home / Self Care | Attending: Emergency Medicine | Admitting: Emergency Medicine

## 2024-02-29 ENCOUNTER — Other Ambulatory Visit: Payer: Self-pay

## 2024-02-29 ENCOUNTER — Emergency Department (HOSPITAL_BASED_OUTPATIENT_CLINIC_OR_DEPARTMENT_OTHER): Admitting: Radiology

## 2024-02-29 DIAGNOSIS — M5441 Lumbago with sciatica, right side: Secondary | ICD-10-CM | POA: Insufficient documentation

## 2024-02-29 LAB — PREGNANCY, URINE: Preg Test, Ur: NEGATIVE

## 2024-02-29 MED ORDER — HYDROCODONE-ACETAMINOPHEN 5-325 MG PO TABS: 1.0000 | ORAL_TABLET | Freq: Four times a day (QID) | ORAL | 0 refills | Status: DC | PRN

## 2024-02-29 MED ORDER — HYDROCODONE-ACETAMINOPHEN 5-325 MG PO TABS
1.0000 | ORAL_TABLET | Freq: Four times a day (QID) | ORAL | 0 refills | Status: AC | PRN
  Filled 2024-02-29: qty 20, 5d supply, fill #0

## 2024-02-29 MED ORDER — ONDANSETRON 4 MG PO TBDP
4.0000 mg | ORAL_TABLET | Freq: Once | ORAL | Status: AC
  Administered 2024-02-29: 4 mg via ORAL
  Filled 2024-02-29: qty 1

## 2024-02-29 MED ORDER — ONDANSETRON 4 MG PO TBDP
4.0000 mg | ORAL_TABLET | Freq: Three times a day (TID) | ORAL | 0 refills | Status: AC | PRN
  Filled 2024-02-29: qty 20, 7d supply, fill #0

## 2024-02-29 MED ORDER — HYDROMORPHONE HCL 1 MG/ML IJ SOLN
2.0000 mg | Freq: Once | INTRAMUSCULAR | Status: AC
  Administered 2024-02-29: 2 mg via INTRAMUSCULAR
  Filled 2024-02-29: qty 2

## 2024-02-29 NOTE — ED Notes (Signed)
 Patient transported to X-ray

## 2024-02-29 NOTE — ED Provider Notes (Addendum)
 [place] EMERGENCY DEPARTMENT AT [facility] Provider Note   CSN: [id number] Arrival date & time: 02/29/24  1327     Patient presents with: Leg Pain   Vanessa Bennett is a 38 y.o. female.   Was a complaint of right sided back pain for little bit over 2 weeks.  Patient seen August 4 year for similar problem started on steroids not helping.  Pain radiates down towards the right ankle.  No fall or injury.  Patient several years ago had similar problem was on steroids and it got better.  Patient just started a new job that requires a lot of movement.  Patient has established a primary care doctor but they cannot see her until end of August.  Patient did not follow-up with sports medicine orthopedics or neurosurgery.  Denies any numbness to the top or bottom of the foot.  Or any weakness.  But the pain is very debilitating.  Past medical history significant for the past history of sciatica ADHD eczema.  Patient's had her gallbladder removed.  Patient is never used tobacco products.  No involvement of the left leg.  No incontinence problems.       Prior to Admission medications   Medication Sig Start Date End Date Taking? Authorizing Provider  cetirizine (ZYRTEC) 5 MG chewable tablet Chew 5 mg by mouth daily.    [provider]  naproxen  (NAPROSYN ) 500 MG tablet Take 1 tablet (500 mg total) by mouth 2 (two) times daily as needed (pain). 02/26/24   Romelle Booty, MD  predniSONE  (DELTASONE ) 50 MG tablet Take 1 tablet (50 mg total) by mouth daily with breakfast. For 5 days 02/26/24   Romelle Booty, MD    Allergies: Penicillins    Review of Systems  Constitutional:  Negative for chills and fever.  HENT:  Negative for ear pain and sore throat.   Eyes:  Negative for pain and visual disturbance.  Respiratory:  Negative for cough and shortness of breath.   Cardiovascular:  Negative for chest pain and palpitations.  Gastrointestinal:  Negative for abdominal pain and vomiting.   Genitourinary:  Negative for difficulty urinating, dysuria and hematuria.  Musculoskeletal:  Positive for back pain. Negative for arthralgias.  Skin:  Negative for color change and rash.  Neurological:  Negative for seizures, syncope, weakness and numbness.  All other systems reviewed and are negative.   Updated Vital Signs BP (!) 151/101 (BP Location: Left Arm)   Pulse 83   Temp 98.3 F (36.8 C)   Resp 18   Ht 1.702 m (5' 7)   Wt 99.2 kg   LMP 02/21/2024 (Approximate)   SpO2 100%   BMI 34.24 kg/m   Physical Exam Vitals and nursing note reviewed.  Constitutional:      General: She is not in acute distress.    Appearance: Normal appearance. She is well-developed.  HENT:     Head: Normocephalic and atraumatic.  Eyes:     Extraocular Movements: Extraocular movements intact.     Conjunctiva/sclera: Conjunctivae normal.     Pupils: Pupils are equal, round, and reactive to light.  Cardiovascular:     Rate and Rhythm: Normal rate and regular rhythm.     Heart sounds: No murmur heard. Pulmonary:     Effort: Pulmonary effort is normal. No respiratory distress.     Breath sounds: Normal breath sounds.  Abdominal:     Palpations: Abdomen is soft.     Tenderness: There is no abdominal tenderness.  Musculoskeletal:  General: No swelling.     Cervical back: Normal range of motion and neck supple.     Right lower leg: No edema.     Left lower leg: No edema.     Comments: Mild tenderness palpation kind of right side of lumbar spine.  Right lower extremity neurovascularly intact.  No numbness no weakness.  Is associated with pain with movement.  Skin:    General: Skin is warm and dry.     Capillary Refill: Capillary refill takes less than 2 seconds.  Neurological:     General: No focal deficit present.     Mental Status: She is alert and oriented to person, place, and time.     Cranial Nerves: No cranial nerve deficit.     Sensory: No sensory deficit.     Motor: No  weakness.  Psychiatric:        Mood and Affect: Mood normal.     (all labs ordered are listed, but only abnormal results are displayed) Labs Reviewed  PREGNANCY, URINE    EKG: None  Radiology: No results found.   Procedures   Medications Ordered in the ED  ondansetron  (ZOFRAN -ODT) disintegrating tablet 4 mg (has no administration in time range)  HYDROmorphone  (DILAUDID ) injection 2 mg (has no administration in time range)                                    Medical Decision Making Amount and/or Complexity of Data Reviewed Labs: ordered. Radiology: ordered.  Risk Prescription drug management.   Patient symptoms consistent with sciatica.  Unfortunately steroids not helping.  Will give some IM hydromorphone  here.  Will get lumbar x-rays.  Will refer her to orthopedics for additional follow-up.  Probably going to need to rest work note will be provided.  No evidence of any concerns of cauda equina.  Pregnancy test negative.  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  No pars defect.  Will treat patient symptomatically have her follow-up with orthopedics.  Final diagnoses:  Acute right-sided low back pain with right-sided sciatica    ED Discharge Orders     None          Geraldene Hamilton, MD 02/29/24 1403    Geraldene Hamilton, MD 02/29/24 [phone]

## 2024-02-29 NOTE — ED Triage Notes (Signed)
 Pt c/o pain radiating down R leg x3 wks, seen Monday for same but reports no relief from meds. Denies traumatic injury.

## 2024-02-29 NOTE — Discharge Instructions (Addendum)
 Schedule an appointment to follow-up with orthopedics.  Take the pain medicine and nausea medicine as directed.  You can finish out your course of steroids.  Work note provided.  Rest off your feet is much as possible.  X-rays of the back without any acute findings.  But there was some arthropathy at L4-L5 on the right side which may be responsible for the pain or contributing to the pain.

## 2024-03-05 ENCOUNTER — Other Ambulatory Visit (HOSPITAL_BASED_OUTPATIENT_CLINIC_OR_DEPARTMENT_OTHER): Payer: Self-pay

## 2024-05-07 ENCOUNTER — Ambulatory Visit: Attending: Physical Medicine and Rehabilitation

## 2024-05-07 ENCOUNTER — Other Ambulatory Visit: Payer: Self-pay

## 2024-05-07 DIAGNOSIS — M5441 Lumbago with sciatica, right side: Secondary | ICD-10-CM | POA: Diagnosis present

## 2024-05-07 DIAGNOSIS — M6281 Muscle weakness (generalized): Secondary | ICD-10-CM | POA: Insufficient documentation

## 2024-05-07 DIAGNOSIS — R262 Difficulty in walking, not elsewhere classified: Secondary | ICD-10-CM | POA: Diagnosis present

## 2024-05-07 DIAGNOSIS — G8929 Other chronic pain: Secondary | ICD-10-CM | POA: Insufficient documentation

## 2024-05-07 NOTE — Therapy (Signed)
 OUTPATIENT PHYSICAL THERAPY THORACOLUMBAR EVALUATION   Patient Name: Vanessa Bennett MRN: [id number] DOB:October 03, 1985, 38 y.o., female Today's Date: 05/07/2024  END OF SESSION:  PT End of Session - 05/07/24 1154     Visit Number 1    Number of Visits 12    Date for Recertification  06/28/24    Authorization Type [place] MEDICAID [facility]    Authorization - Visit Number 1    PT Start Time 1150    PT Stop Time 1235    PT Time Calculation (min) 45 min    Activity Tolerance Patient tolerated treatment well    Behavior During Therapy [facility] for tasks assessed/performed          Past Medical History:  Diagnosis Date   ADHD    Carpal tunnel syndrome    Eczema    Sciatica    Past Surgical History:  Procedure Laterality Date   CHOLECYSTECTOMY  2010   Patient Active Problem List   Diagnosis Date Noted   History of prediabetes 09/12/2022   Healthcare maintenance 09/12/2022   Thyroid  function test abnormal 09/12/2022   Acute right-sided low back pain with right-sided sciatica 08/31/2022    PCP: Cesario Boer, MD   REFERRING PROVIDER: Cesario Boer, MD   REFERRING DIAG: Low back  Rationale for Evaluation and Treatment: Rehabilitation  THERAPY DIAG:  Chronic right-sided low back pain with right-sided sciatica  Difficulty in walking, not elsewhere classified  Muscle weakness (generalized)  ONSET DATE: July  SUBJECTIVE:                                                                                                                                                                                           SUBJECTIVE STATEMENT: Pt reports developing R low back, buttock, and post/lat R LE pain around July without an obvious incident. Now the pain is better, but she can develop significant pain esp with sitting/driving. Pt endorses N/T of her R lateral calf and the bottom of the medial 3 toes.   PERTINENT HISTORY:  High BMI  PAIN:  Are you having pain? Yes: NPRS scale: 1-6/10 Pain  location: R low back, buttock, and post/lat R LE Pain description: intermittent, sharp, throbbing Aggravating factors: Sitting, prolonged standing/wwalking Relieving factors: The pain will stop on it own rest  PRECAUTIONS: None  RED FLAGS: None   WEIGHT BEARING RESTRICTIONS: No  FALLS:  Has patient fallen in last 6 months? No  LIVING ENVIRONMENT: Lives with: lives with their family Lives in: House/apartment Able to access home  OCCUPATION: [employer], PCA  PLOF: Independent  PATIENT GOALS: Less pain   OBJECTIVE:  Note:  Objective measures were completed at Evaluation unless otherwise noted.  DIAGNOSTIC FINDINGS:  02/29/24 IMPRESSION: 1. No acute abnormality in lumbar spine. 2. Facet arthropathy at L4-L5.  PATIENT SURVEYS:  Modified Oswestry: 16/50=32%  Interpretation of scores: Score Category Description  0-20% Minimal Disability The patient can cope with most living activities. Usually no treatment is indicated apart from advice on lifting, sitting and exercise  21-40% Moderate Disability The patient experiences more pain and difficulty with sitting, lifting and standing. Travel and social life are more difficult and they may be disabled from work. Personal care, sexual activity and sleeping are not grossly affected, and the patient can usually be managed by conservative means  41-60% Severe Disability Pain remains the main problem in this group, but activities of daily living are affected. These patients require a detailed investigation  61-80% Crippled Back pain impinges on all aspects of the patient's life. Positive intervention is required  81-100% Bed-bound These patients are either bed-bound or exaggerating their symptoms   Minimally Clinically Important Difference (MCID) = 12.8%  COGNITION: Overall cognitive status: Within functional limits for tasks assessed     SENSATION: WFL  MUSCLE LENGTH: Hamstrings: Right WNLs deg; Left WNLs deg Debby  test: Right WNLs deg; Left WNLs deg  POSTURE: rounded shoulders, forward head, and genu valgum  PALPATION: Not TTP  LUMBAR ROM:   AROM eval  Flexion Full, min increase pulling  Extension Full, min pressure pain  Right lateral flexion Full, min pressure pain  Left lateral flexion Full, no pain  Right rotation Full  Left rotation Full   (Blank rows = not tested)  LOWER EXTREMITY ROM:    Grossly WNLs Active  Right eval Left eval  Hip flexion    Hip extension    Hip abduction    Hip adduction    Hip internal rotation    Hip external rotation    Knee flexion    Knee extension    Ankle dorsiflexion    Ankle plantarflexion    Ankle inversion    Ankle eversion     (Blank rows = not tested)  LOWER EXTREMITY MMT:    MMT Right eval Left eval  Hip flexion 3+ 4+  Hip extension    Hip abduction    Hip adduction    Hip internal rotation    Hip external rotation    Knee flexion 5 5  Knee extension 5 5  Ankle dorsiflexion 5 5  Ankle plantarflexion 5 5  Ankle inversion    Ankle eversion     (Blank rows = not tested)  LUMBAR SPECIAL TESTS:  Straight leg raise test: Negative and Slump test: Negative  FUNCTIONAL TESTS:  5 times sit to stand: TBA  GAIT: Distance walked: 200' Assistive device utilized: None Level of assistance: Complete Independence Comments: WNLs  TREATMENT DATE:  [facility] Adult PT Treatment:                                                DATE: 05/07/24 Therapeutic Exercise: Developed, instructed in, and pt completed therex as noted in HEP  PATIENT EDUCATION:  Education details: Eval findings, POC, HEP, self care  Person educated: Patient Education method: Explanation, Demonstration, Tactile cues, Verbal cues, and Handouts Education comprehension: verbalized understanding, returned demonstration, verbal cues required, and tactile cues  required  HOME EXERCISE PROGRAM: Access Code: [email or web address] URL: [email or web address] Date: 05/07/2024 Prepared by: Dasie Daft  Exercises - Lying Prone  - 4 x daily - 7 x weekly - 1 sets - 1 reps - 2-5 hold - Static Prone on Elbows  - 4 x daily - 7 x weekly - 1 sets - 10 reps - 2-5 hold - Prone Press Up  - 4 x daily - 7 x weekly - 1 sets - 10 reps - 1 hold  ASSESSMENT:  CLINICAL IMPRESSION: Patient is a 38 y.o. female who was seen today for physical therapy evaluation and treatment for low back. Pt presents to PT with radicular R LE pain, N/T, and weakness of the R hip flexion with myotome for L4. An assessment for directional preference for treatment was completed with pt responding positively to extension biased exs with reduction of R LE pain. Pt will benefit from skilled PT 2w6 to address impairments to optimize back/LE function with less pain.    OBJECTIVE IMPAIRMENTS: decreased activity tolerance, decreased strength, obesity, and pain.   ACTIVITY LIMITATIONS: lifting, sitting, standing, and locomotion level  PARTICIPATION LIMITATIONS: meal prep, cleaning, laundry, driving, shopping, community activity, and occupation  PERSONAL FACTORS: Fitness, Past/current experiences, Time since onset of injury/illness/exacerbation, and 1 comorbidity: high BMI are also affecting patient's functional outcome.   REHAB POTENTIAL: Good  CLINICAL DECISION MAKING: Evolving/moderate complexity  EVALUATION COMPLEXITY: Moderate   GOALS:  SHORT TERM GOALS: Target date: 05/31/24  Pt will be Ind in an initial HEP  Baseline:started Goal status: INITIAL  2.  Pt will report 50% or greater decrease in pain with daily activities for improved function and QOL  Baseline:  Goal status: INITIAL  LONG TERM GOALS: Target date: 06/28/24  Pt will be Ind in a final HEP to maintain achieved LOF Baseline: started Goal status: INITIAL  2.  Pt will report 75% or greater decrease in pain with  daily activities for improved function and QOL  Baseline:  Goal status: INITIAL  3.  Pt will demonstrate appropriate understanding of proper body mechanics with daily activities to minimize pain and strain Baseline:  Goal status: INITIAL  4.  Improve 5xSTS by MCID of 5 as indication of improved functional mobility Baseline: TBA Goal status: INITIAL  5.  Pt's Mod Oswestry score will improve by the MCID to 19% as indication of improved function  Baseline: 32% Goal status: INITIAL  PLAN:  PT FREQUENCY: 2x/week  PT DURATION: 6 weeks  PLANNED INTERVENTIONS: 97164- PT Re-evaluation, 97110-Therapeutic exercises, 97530- Therapeutic activity, 97112- Neuromuscular re-education, 97535- Self Care, 02859- Manual therapy, [id number]- Aquatic Therapy, H9716- Electrical stimulation (unattended), 20560 (1-2 muscles), 20561 (3+ muscles)- Dry Needling, Patient/Family education, Taping, Spinal mobilization, Cryotherapy, and Moist heat.  PLAN FOR NEXT SESSION: Assess 5xSTS; assess response to HEP; progress therex as indicated; use of modalities, manual therapy; and TPDN as indicated.    Bridgette Wolden MS, PT 05/07/24 6:31 PM

## 2024-05-14 ENCOUNTER — Ambulatory Visit: Admitting: Physical Therapy

## 2024-05-14 ENCOUNTER — Encounter: Payer: Self-pay | Admitting: Physical Therapy

## 2024-05-14 DIAGNOSIS — R262 Difficulty in walking, not elsewhere classified: Secondary | ICD-10-CM

## 2024-05-14 DIAGNOSIS — G8929 Other chronic pain: Secondary | ICD-10-CM

## 2024-05-14 DIAGNOSIS — M5441 Lumbago with sciatica, right side: Secondary | ICD-10-CM | POA: Diagnosis not present

## 2024-05-14 NOTE — Therapy (Signed)
 OUTPATIENT PHYSICAL THERAPY THORACOLUMBAR TREATMENT   Patient Name: Vanessa Bennett MRN: [id number] DOB:June 28, 1986, 38 y.o., female Today's Date: 05/14/2024  END OF SESSION:  PT End of Session - 05/14/24 0803     Visit Number 2    Number of Visits 12    Date for Recertification  06/28/24    Authorization Type [place] MEDICAID [facility]    Authorization - Visit Number 2    Authorization - Number of Visits 10    PT Start Time 0801    PT Stop Time 0839    PT Time Calculation (min) 38 min          Past Medical History:  Diagnosis Date   ADHD    Carpal tunnel syndrome    Eczema    Sciatica    Past Surgical History:  Procedure Laterality Date   CHOLECYSTECTOMY  2010   Patient Active Problem List   Diagnosis Date Noted   History of prediabetes 09/12/2022   Healthcare maintenance 09/12/2022   Thyroid  function test abnormal 09/12/2022   Acute right-sided low back pain with right-sided sciatica 08/31/2022    PCP: Cesario Boer, MD   REFERRING PROVIDER: Cesario Boer, MD   REFERRING DIAG: Low back  Rationale for Evaluation and Treatment: Rehabilitation  THERAPY DIAG:  Chronic right-sided low back pain with right-sided sciatica  Difficulty in walking, not elsewhere classified  ONSET DATE: July  SUBJECTIVE:                                                                                                                                                                                           SUBJECTIVE STATEMENT: Pt reports throbbing pain in toes today. Feels the prone press ups make her toes throb.   EVAL: Pt reports developing R low back, buttock, and post/lat R LE pain around July without an obvious incident. Now the pain is better, but she can develop significant pain esp with sitting/driving. Pt endorses N/T of her R lateral calf and the bottom of the medial 3 toes.   PERTINENT HISTORY:  High BMI  PAIN:  Are you having pain? Yes: NPRS scale: 1-6/10 Pain location: R low  back, buttock, and post/lat R LE Pain description: intermittent, sharp, throbbing Aggravating factors: Sitting, prolonged standing/wwalking Relieving factors: The pain will stop on it own rest  PRECAUTIONS: None  RED FLAGS: None   WEIGHT BEARING RESTRICTIONS: No  FALLS:  Has patient fallen in last 6 months? No  LIVING ENVIRONMENT: Lives with: lives with their family Lives in: House/apartment Able to access home  OCCUPATION: [employer], PCA  PLOF: Independent  PATIENT GOALS: Less pain  OBJECTIVE:  Note: Objective measures were completed at Evaluation unless otherwise noted.  DIAGNOSTIC FINDINGS:  02/29/24 IMPRESSION: 1. No acute abnormality in lumbar spine. 2. Facet arthropathy at L4-L5.  PATIENT SURVEYS:  Modified Oswestry: 16/50=32%  Interpretation of scores: Score Category Description  0-20% Minimal Disability The patient can cope with most living activities. Usually no treatment is indicated apart from advice on lifting, sitting and exercise  21-40% Moderate Disability The patient experiences more pain and difficulty with sitting, lifting and standing. Travel and social life are more difficult and they may be disabled from work. Personal care, sexual activity and sleeping are not grossly affected, and the patient can usually be managed by conservative means  41-60% Severe Disability Pain remains the main problem in this group, but activities of daily living are affected. These patients require a detailed investigation  61-80% Crippled Back pain impinges on all aspects of the patient's life. Positive intervention is required  81-100% Bed-bound These patients are either bed-bound or exaggerating their symptoms   Minimally Clinically Important Difference (MCID) = 12.8%  COGNITION: Overall cognitive status: Within functional limits for tasks assessed     SENSATION: WFL  MUSCLE LENGTH: Hamstrings: Right WNLs deg; Left WNLs deg Debby test: Right WNLs  deg; Left WNLs deg  POSTURE: rounded shoulders, forward head, and genu valgum  PALPATION: Not TTP  LUMBAR ROM:   AROM eval  Flexion Full, min increase pulling  Extension Full, min pressure pain  Right lateral flexion Full, min pressure pain  Left lateral flexion Full, no pain  Right rotation Full  Left rotation Full   (Blank rows = not tested)  LOWER EXTREMITY ROM:    Grossly WNLs Active  Right eval Left eval  Hip flexion    Hip extension    Hip abduction    Hip adduction    Hip internal rotation    Hip external rotation    Knee flexion    Knee extension    Ankle dorsiflexion    Ankle plantarflexion    Ankle inversion    Ankle eversion     (Blank rows = not tested)  LOWER EXTREMITY MMT:    MMT Right eval Left eval  Hip flexion 3+ 4+  Hip extension    Hip abduction    Hip adduction    Hip internal rotation    Hip external rotation    Knee flexion 5 5  Knee extension 5 5  Ankle dorsiflexion 5 5  Ankle plantarflexion 5 5  Ankle inversion    Ankle eversion     (Blank rows = not tested)  LUMBAR SPECIAL TESTS:  Straight leg raise test: Negative and Slump test: Negative  FUNCTIONAL TESTS:  5 times sit to stand: TBA 11.1 sec 5 x STS 05/14/24  GAIT: Distance walked: 200' Assistive device utilized: None Level of assistance: Complete Independence Comments: WNLs  TREATMENT DATE: [facility] Adult PT Treatment:                                                DATE: 05/14/24  Therapeutic Activity: 5 STS 11.1 sec  POE Press ups x 10  Prone alt hip ext x 10  Prone Alt UE /LE 2 x 10  Right hip abduction x 10 right SLR 2 x 10 with ab brace  Left hip abduction x 10 each  Bridge x 10  Updated HEP     [facility] Adult PT Treatment:                                                DATE: 05/07/24 Therapeutic Exercise: Developed, instructed in, and pt completed therex as noted in HEP                                                                                                                      PATIENT EDUCATION:  Education details: Eval findings, POC, HEP, self care  Person educated: Patient Education method: Explanation, Demonstration, Tactile cues, Verbal cues, and Handouts Education comprehension: verbalized understanding, returned demonstration, verbal cues required, and tactile cues required  HOME EXERCISE PROGRAM: Access Code: [email or web address] URL: [email or web address] Date: 05/07/2024 Prepared by: Dasie Daft  Exercises - Lying Prone  - 4 x daily - 7 x weekly - 1 sets - 1 reps - 2-5 hold - Static Prone on Elbows  - 4 x daily - 7 x weekly - 1 sets - 10 reps - 2-5 hold - Prone Press Up  - 4 x daily - 7 x weekly - 1 sets - 10 reps - 1 hold Added 05/14/24 - Sidelying Hip Abduction  - 1 x daily - 7 x weekly - 2 sets - 10 reps - Prone Alternating Arm and Leg Lifts  - 1 x daily - 7 x weekly - 2 sets - 10 reps - Supine Bridge  - 1 x daily - 7 x weekly - 2 sets - 10 reps - 5 hold    ASSESSMENT:  CLINICAL IMPRESSION: Pt reports compliance with HEP and thobbing in right lateral toes on arrival. Reviewed HEP and progressed with Extension biased core/hip strength and updated HEP. Most weakness evident with right hip extension.    EVAL: Patient is a 38 y.o. female who was seen today for physical therapy evaluation and treatment for low back. Pt presents to PT with radicular R LE pain, N/T, and weakness of the R hip flexion with myotome for L4. An assessment for directional preference for treatment was completed with pt responding positively to extension biased exs with reduction of R LE pain. Pt will benefit from skilled PT 2w6 to address impairments to optimize back/LE function with less pain.    OBJECTIVE IMPAIRMENTS: decreased activity tolerance, decreased strength, obesity, and pain.   ACTIVITY LIMITATIONS: lifting, sitting, standing, and locomotion level  PARTICIPATION LIMITATIONS: meal prep, cleaning, laundry, driving, shopping,  community activity, and occupation  PERSONAL FACTORS: Fitness, Past/current experiences, Time since onset of injury/illness/exacerbation, and 1 comorbidity: high BMI are also affecting patient's functional outcome.   REHAB POTENTIAL: Good  CLINICAL DECISION MAKING: Evolving/moderate complexity  EVALUATION COMPLEXITY: Moderate   GOALS:  SHORT TERM GOALS: Target date: 05/31/24  Pt will be Ind in an initial HEP  Baseline:started Goal status: INITIAL  2.  Pt will  report 50% or greater decrease in pain with daily activities for improved function and QOL  Baseline:  Goal status: INITIAL  LONG TERM GOALS: Target date: 06/28/24  Pt will be Ind in a final HEP to maintain achieved LOF Baseline: started Goal status: INITIAL  2.  Pt will report 75% or greater decrease in pain with daily activities for improved function and QOL  Baseline:  Goal status: INITIAL  3.  Pt will demonstrate appropriate understanding of proper body mechanics with daily activities to minimize pain and strain Baseline:  Goal status: INITIAL  4.  Improve 5xSTS by MCID of 5 as indication of improved functional mobility Baseline: TBA Goal status: INITIAL  5.  Pt's Mod Oswestry score will improve by the MCID to 19% as indication of improved function  Baseline: 32% Goal status: INITIAL  PLAN:  PT FREQUENCY: 2x/week  PT DURATION: 6 weeks  PLANNED INTERVENTIONS: 97164- PT Re-evaluation, 97110-Therapeutic exercises, 97530- Therapeutic activity, 97112- Neuromuscular re-education, 97535- Self Care, 02859- Manual therapy, [id number]- Aquatic Therapy, H9716- Electrical stimulation (unattended), 20560 (1-2 muscles), 20561 (3+ muscles)- Dry Needling, Patient/Family education, Taping, Spinal mobilization, Cryotherapy, and Moist heat.  PLAN FOR NEXT SESSION: Assess 5xSTS; assess response to HEP; progress therex as indicated; use of modalities, manual therapy; and TPDN as indicated.    Harlene Persons, PTA 05/14/24 12:05  PM Phone: [phone] Fax: [phone]

## 2024-05-20 NOTE — Therapy (Signed)
 OUTPATIENT PHYSICAL THERAPY THORACOLUMBAR TREATMENT   Patient Name: Vanessa Bennett MRN: [id number] DOB:05/02/1986, 38 y.o., female Today's Date: 05/21/2024  END OF SESSION:  PT End of Session - 05/21/24 0854     Visit Number 3    Number of Visits 12    Date for Recertification  06/28/24    Authorization Type  MEDICAID [facility]    Authorization Time Period Approved 10 PT visits from 05/07/24-07/06/24    Authorization - Visit Number 3    Authorization - Number of Visits 10    PT Start Time 0848    PT Stop Time 0930    PT Time Calculation (min) 42 min    Activity Tolerance Patient tolerated treatment well    Behavior During Therapy [facility] for tasks assessed/performed           Past Medical History:  Diagnosis Date   ADHD    Carpal tunnel syndrome    Eczema    Sciatica    Past Surgical History:  Procedure Laterality Date   CHOLECYSTECTOMY  2010   Patient Active Problem List   Diagnosis Date Noted   History of prediabetes 09/12/2022   Healthcare maintenance 09/12/2022   Thyroid  function test abnormal 09/12/2022   Acute right-sided low back pain with right-sided sciatica 08/31/2022    PCP: Cesario Boer, MD   REFERRING PROVIDER: Cesario Boer, MD   REFERRING DIAG: Low back  Rationale for Evaluation and Treatment: Rehabilitation  THERAPY DIAG:  Chronic right-sided low back pain with right-sided sciatica  Difficulty in walking, not elsewhere classified  Muscle weakness (generalized)  ONSET DATE: July  SUBJECTIVE:                                                                                                                                                                                           SUBJECTIVE STATEMENT: Pt reports R LE posterior thigh pain, 4/10   EVAL: Pt reports developing R low back, buttock, and post/lat R LE pain around July without an obvious incident. Now the pain is better, but she can develop significant pain esp with sitting/driving. Pt  endorses N/T of her R lateral calf and the bottom of the medial 3 toes.   PERTINENT HISTORY:  High BMI  PAIN:  Are you having pain? Yes: NPRS scale: 1-6/10. Currently:4/10 intermittently Pain location: R low back, buttock, and post/lat R LE Pain description: intermittent, sharp, throbbing Aggravating factors: Sitting, prolonged standing/wwalking Relieving factors: The pain will stop on it own rest  PRECAUTIONS: None  RED FLAGS: None   WEIGHT BEARING RESTRICTIONS: No  FALLS:  Has patient fallen in last 6 months? No  LIVING ENVIRONMENT: Lives with: lives with their family Lives in: House/apartment Able to access home  OCCUPATION: [employer], PCA  PLOF: Independent  PATIENT GOALS: Less pain   OBJECTIVE:  Note: Objective measures were completed at Evaluation unless otherwise noted.  DIAGNOSTIC FINDINGS:  02/29/24 IMPRESSION: 1. No acute abnormality in lumbar spine. 2. Facet arthropathy at L4-L5.  PATIENT SURVEYS:  Modified Oswestry: 16/50=32%  Interpretation of scores: Score Category Description  0-20% Minimal Disability The patient can cope with most living activities. Usually no treatment is indicated apart from advice on lifting, sitting and exercise  21-40% Moderate Disability The patient experiences more pain and difficulty with sitting, lifting and standing. Travel and social life are more difficult and they may be disabled from work. Personal care, sexual activity and sleeping are not grossly affected, and the patient can usually be managed by conservative means  41-60% Severe Disability Pain remains the main problem in this group, but activities of daily living are affected. These patients require a detailed investigation  61-80% Crippled Back pain impinges on all aspects of the patient's life. Positive intervention is required  81-100% Bed-bound These patients are either bed-bound or exaggerating their symptoms   Minimally Clinically Important  Difference (MCID) = 12.8%  COGNITION: Overall cognitive status: Within functional limits for tasks assessed     SENSATION: WFL  MUSCLE LENGTH: Hamstrings: Right WNLs deg; Left WNLs deg Debby test: Right WNLs deg; Left WNLs deg  POSTURE: rounded shoulders, forward head, and genu valgum  PALPATION: Not TTP  LUMBAR ROM:   AROM eval  Flexion Full, min increase pulling  Extension Full, min pressure pain  Right lateral flexion Full, min pressure pain  Left lateral flexion Full, no pain  Right rotation Full  Left rotation Full   (Blank rows = not tested)  LOWER EXTREMITY ROM:    Grossly WNLs Active  Right eval Left eval  Hip flexion    Hip extension    Hip abduction    Hip adduction    Hip internal rotation    Hip external rotation    Knee flexion    Knee extension    Ankle dorsiflexion    Ankle plantarflexion    Ankle inversion    Ankle eversion     (Blank rows = not tested)  LOWER EXTREMITY MMT:    MMT Right eval Left eval  Hip flexion 3+ 4+  Hip extension    Hip abduction    Hip adduction    Hip internal rotation    Hip external rotation    Knee flexion 5 5  Knee extension 5 5  Ankle dorsiflexion 5 5  Ankle plantarflexion 5 5  Ankle inversion    Ankle eversion     (Blank rows = not tested)  LUMBAR SPECIAL TESTS:  Straight leg raise test: Negative and Slump test: Negative  FUNCTIONAL TESTS:  5 times sit to stand: TBA 11.1 sec 5 x STS 05/14/24  GAIT: Distance walked: 200' Assistive device utilized: None Level of assistance: Complete Independence Comments: WNLs  TREATMENT DATE: [facility] Adult PT  [facility] Adult PT Treatment:                                                DATE: 05/21/24 Therapeutic Activity: Prone lying POE Press ups x 10, pain in toes Press up x10 with hips  stabilized by PT, pain in toes resolved Press up x5, pt focus on keeping hips down, pain in toes stayed resolved Prone alt hip ext x 10  Prone alt knee flexion x10 Prone  Alt UE /LE x 10  Right hip abduction x 10 right Supine sciatic nerve glide x10 SLR 2 x 10 with ab brace  Left hip abduction 2 x 10 each  Bridge 2 x 10 Updated HEP  Treatment:                                                DATE: 05/14/24 Therapeutic Activity: 5 STS 11.1 sec  POE Press ups x 10  Prone alt hip ext x 10  Prone Alt UE /LE 2 x 10  Right hip abduction x 10 right SLR 2 x 10 with ab brace  Left hip abduction x 10 each  Bridge x 10 Updated HEP  [facility] Adult PT Treatment:                                                DATE: 05/07/24 Therapeutic Exercise: Developed, instructed in, and pt completed therex as noted in HEP                                                                                                                     PATIENT EDUCATION:  Education details: Eval findings, POC, HEP, self care  Person educated: Patient Education method: Explanation, Demonstration, Tactile cues, Verbal cues, and Handouts Education comprehension: verbalized understanding, returned demonstration, verbal cues required, and tactile cues required  HOME EXERCISE PROGRAM: Access Code: [email or web address] URL: [email or web address] Date: 05/07/2024 Prepared by: Dasie Daft  Exercises - Lying Prone  - 4 x daily - 7 x weekly - 1 sets - 1 reps - 2-5 hold - Static Prone on Elbows  - 4 x daily - 7 x weekly - 1 sets - 10 reps - 2-5 hold - Prone Press Up  - 4 x daily - 7 x weekly - 1 sets - 10 reps - 1 hold Added 05/14/24 - Sidelying Hip Abduction  - 1 x daily - 7 x weekly - 2 sets - 10 reps - Prone Alternating Arm and Leg Lifts  - 1 x daily - 7 x weekly - 2 sets - 10 reps - Supine Bridge  - 1 x daily - 7 x weekly - 2 sets - 10 reps - 5 hold    ASSESSMENT:  CLINICAL IMPRESSION: Pt's R posterior leg/foot pain responded positively to to prone press ups with hip stabilized by either this PT or pt focusing in keep hip down during the press ups. A sciatic nerve stretch was added to  her HEP. Additional exs were completed for lumbopelvic  strengthening. Pt tolerated prescribed exs in PT today without adverse effects. Pt will continue to benefit from skilled PT to address impairments for improved function c minimized pain.  EVAL: Patient is a 38 y.o. female who was seen today for physical therapy evaluation and treatment for low back. Pt presents to PT with radicular R LE pain, N/T, and weakness of the R hip flexion with myotome for L4. An assessment for directional preference for treatment was completed with pt responding positively to extension biased exs with reduction of R LE pain. Pt will benefit from skilled PT 2w6 to address impairments to optimize back/LE function with less pain.    OBJECTIVE IMPAIRMENTS: decreased activity tolerance, decreased strength, obesity, and pain.   ACTIVITY LIMITATIONS: lifting, sitting, standing, and locomotion level  PARTICIPATION LIMITATIONS: meal prep, cleaning, laundry, driving, shopping, community activity, and occupation  PERSONAL FACTORS: Fitness, Past/current experiences, Time since onset of injury/illness/exacerbation, and 1 comorbidity: high BMI are also affecting patient's functional outcome.   REHAB POTENTIAL: Good  CLINICAL DECISION MAKING: Evolving/moderate complexity  EVALUATION COMPLEXITY: Moderate   GOALS:  SHORT TERM GOALS: Target date: 05/31/24  Pt will be Ind in an initial HEP  Baseline:started Goal status: INITIAL  2.  Pt will report 50% or greater decrease in pain with daily activities for improved function and QOL  Baseline:  Goal status: INITIAL  LONG TERM GOALS: Target date: 06/28/24  Pt will be Ind in a final HEP to maintain achieved LOF Baseline: started Goal status: INITIAL  2.  Pt will report 75% or greater decrease in pain with daily activities for improved function and QOL  Baseline:  Goal status: INITIAL  3.  Pt will demonstrate appropriate understanding of proper body mechanics with daily  activities to minimize pain and strain Baseline:  Goal status: INITIAL  4.  Improve 5xSTS by MCID of 5 as indication of improved functional mobility Baseline: TBA Goal status: INITIAL  5.  Pt's Mod Oswestry score will improve by the MCID to 19% as indication of improved function  Baseline: 32% Goal status: INITIAL  PLAN:  PT FREQUENCY: 2x/week  PT DURATION: 6 weeks  PLANNED INTERVENTIONS: 97164- PT Re-evaluation, 97110-Therapeutic exercises, 97530- Therapeutic activity, 97112- Neuromuscular re-education, 97535- Self Care, 02859- Manual therapy, [id number]- Aquatic Therapy, H9716- Electrical stimulation (unattended), 20560 (1-2 muscles), 20561 (3+ muscles)- Dry Needling, Patient/Family education, Taping, Spinal mobilization, Cryotherapy, and Moist heat.  PLAN FOR NEXT SESSION: Assess 5xSTS; assess response to HEP; progress therex as indicated; use of modalities, manual therapy; and TPDN as indicated.   Aby Gessel MS, PT 05/21/24 9:31 AM

## 2024-05-21 ENCOUNTER — Ambulatory Visit

## 2024-05-21 DIAGNOSIS — G8929 Other chronic pain: Secondary | ICD-10-CM

## 2024-05-21 DIAGNOSIS — R262 Difficulty in walking, not elsewhere classified: Secondary | ICD-10-CM

## 2024-05-21 DIAGNOSIS — M6281 Muscle weakness (generalized): Secondary | ICD-10-CM

## 2024-05-21 DIAGNOSIS — M5441 Lumbago with sciatica, right side: Secondary | ICD-10-CM | POA: Diagnosis not present

## 2024-05-22 NOTE — Therapy (Signed)
 OUTPATIENT PHYSICAL THERAPY THORACOLUMBAR TREATMENT   Patient Name: Vanessa Bennett MRN: [id number] DOB:04-20-1986, 38 y.o., female Today's Date: 05/23/2024  END OF SESSION:  PT End of Session - 05/23/24 1338     Visit Number 4    Number of Visits 12    Date for Recertification  06/28/24    Authorization Type [place] MEDICAID [facility]    Authorization Time Period Approved 10 PT visits from 05/07/24-07/06/24    Authorization - Visit Number 4    Authorization - Number of Visits 10    PT Start Time 1017    PT Stop Time 1059    PT Time Calculation (min) 42 min    Activity Tolerance Patient tolerated treatment well    Behavior During Therapy [facility] for tasks assessed/performed            Past Medical History:  Diagnosis Date   ADHD    Carpal tunnel syndrome    Eczema    Sciatica    Past Surgical History:  Procedure Laterality Date   CHOLECYSTECTOMY  2010   Patient Active Problem List   Diagnosis Date Noted   History of prediabetes 09/12/2022   Healthcare maintenance 09/12/2022   Thyroid  function test abnormal 09/12/2022   Acute right-sided low back pain with right-sided sciatica 08/31/2022    PCP: Cesario Boer, MD   REFERRING PROVIDER: Cesario Boer, MD   REFERRING DIAG: Low back  Rationale for Evaluation and Treatment: Rehabilitation  THERAPY DIAG:  Chronic right-sided low back pain with right-sided sciatica  Difficulty in walking, not elsewhere classified  Muscle weakness (generalized)  ONSET DATE: July  SUBJECTIVE:                                                                                                                                                                                           SUBJECTIVE STATEMENT: Pt reports she is currently not experiencing low back or R leg pain today. Pt states she has been very careful with lifting. She is still experiencing N/T of her R lateral calf and 4th and 5th toes.  EVAL: Pt reports developing R low back, buttock,  and post/lat R LE pain around July without an obvious incident. Now the pain is better, but she can develop significant pain esp with sitting/driving. Pt endorses N/T of her R lateral calf and the bottom of the medial 3 toes.   PERTINENT HISTORY:  High BMI  PAIN:  Are you having pain? Yes: NPRS scale: 1-6/10. Currently:4/10 intermittently Pain location: R low back, buttock, and post/lat R LE Pain description: intermittent, sharp, throbbing Aggravating factors: Sitting, prolonged standing/wwalking Relieving factors: The pain will  stop on it own rest  PRECAUTIONS: None  RED FLAGS: None   WEIGHT BEARING RESTRICTIONS: No  FALLS:  Has patient fallen in last 6 months? No  LIVING ENVIRONMENT: Lives with: lives with their family Lives in: House/apartment Able to access home  OCCUPATION: [employer], PCA  PLOF: Independent  PATIENT GOALS: Less pain   OBJECTIVE:  Note: Objective measures were completed at Evaluation unless otherwise noted.  DIAGNOSTIC FINDINGS:  02/29/24 IMPRESSION: 1. No acute abnormality in lumbar spine. 2. Facet arthropathy at L4-L5.  PATIENT SURVEYS:  Modified Oswestry: 16/50=32%  Interpretation of scores: Score Category Description  0-20% Minimal Disability The patient can cope with most living activities. Usually no treatment is indicated apart from advice on lifting, sitting and exercise  21-40% Moderate Disability The patient experiences more pain and difficulty with sitting, lifting and standing. Travel and social life are more difficult and they may be disabled from work. Personal care, sexual activity and sleeping are not grossly affected, and the patient can usually be managed by conservative means  41-60% Severe Disability Pain remains the main problem in this group, but activities of daily living are affected. These patients require a detailed investigation  61-80% Crippled Back pain impinges on all aspects of the patient's life.  Positive intervention is required  81-100% Bed-bound These patients are either bed-bound or exaggerating their symptoms   Minimally Clinically Important Difference (MCID) = 12.8%  COGNITION: Overall cognitive status: Within functional limits for tasks assessed     SENSATION: WFL  MUSCLE LENGTH: Hamstrings: Right WNLs deg; Left WNLs deg Debby test: Right WNLs deg; Left WNLs deg  POSTURE: rounded shoulders, forward head, and genu valgum  PALPATION: Not TTP  LUMBAR ROM:   AROM eval  Flexion Full, min increase pulling  Extension Full, min pressure pain  Right lateral flexion Full, min pressure pain  Left lateral flexion Full, no pain  Right rotation Full  Left rotation Full   (Blank rows = not tested)  LOWER EXTREMITY ROM:    Grossly WNLs Active  Right eval Left eval  Hip flexion    Hip extension    Hip abduction    Hip adduction    Hip internal rotation    Hip external rotation    Knee flexion    Knee extension    Ankle dorsiflexion    Ankle plantarflexion    Ankle inversion    Ankle eversion     (Blank rows = not tested)  LOWER EXTREMITY MMT:    MMT Right eval Left eval  Hip flexion 3+ 4+  Hip extension    Hip abduction    Hip adduction    Hip internal rotation    Hip external rotation    Knee flexion 5 5  Knee extension 5 5  Ankle dorsiflexion 5 5  Ankle plantarflexion 5 5  Ankle inversion    Ankle eversion     (Blank rows = not tested)  LUMBAR SPECIAL TESTS:  Straight leg raise test: Negative and Slump test: Negative  FUNCTIONAL TESTS:  5 times sit to stand: TBA 11.1 sec 5 x STS 05/14/24  GAIT: Distance walked: 200' Assistive device utilized: None Level of assistance: Complete Independence Comments: WNLs  TREATMENT DATE: [facility] Adult PT  [facility] Adult PT Treatment:  DATE: 08/24/23 Therapeutic Exercise/Activity: Supine sciatic nerve glide x10 Bridge 2 x 10 TA bracing x10 90/90 AB bracing x5  10 SLR x 15 with ab brace  Prone lying POE Press up x10 with hips stabilized by pt Prone Alt UE /LE x 10  Hinged hip lifting c dowel x10, s dowel, c 15# Updated HEP  [facility] Adult PT Treatment:                                                DATE: 05/21/24 Therapeutic Activity: Prone lying POE Press ups x 10, pain in toes Press up x10 with hips stabilized by PT, pain in toes resolved Press up x5, pt focus on keeping hips down, pain in toes stayed resolved Prone alt hip ext x 10  Prone alt knee flexion x10 Prone Alt UE /LE x 10  Right hip abduction x 10 right Supine sciatic nerve glide x10 SLR 2 x 10 with ab brace  Left hip abduction 2 x 10 each  Bridge 2 x 10 Updated HEP  Treatment:                                                DATE: 05/14/24 Therapeutic Activity: 5 STS 11.1 sec  POE Press ups x 10  Prone alt hip ext x 10  Prone Alt UE /LE 2 x 10  Right hip abduction x 10 right SLR 2 x 10 with ab brace  Left hip abduction x 10 each  Bridge x 10 Updated HEP  [facility] Adult PT Treatment:                                                DATE: 05/07/24 Therapeutic Exercise: Developed, instructed in, and pt completed therex as noted in HEP                                                                                                                     PATIENT EDUCATION:  Education details: Eval findings, POC, HEP, self care  Person educated: Patient Education method: Explanation, Demonstration, Tactile cues, Verbal cues, and Handouts Education comprehension: verbalized understanding, returned demonstration, verbal cues required, and tactile cues required  HOME EXERCISE PROGRAM: Access Code: [email or web address] URL: [email or web address] Date: 05/23/2024 Prepared by: Dasie Daft  Exercises - Lying Prone  - 4 x daily - 7 x weekly - 1 sets - 1 reps - 2-5 hold - Static Prone on Elbows  - 4 x daily - 7 x weekly - 1 sets - 10 reps - 2-5 hold - Prone Press Up  - 4 x daily -  7 x weekly - 1 sets - 10 reps - 1 hold - Sidelying Hip Abduction  - 1 x daily - 7 x weekly - 2 sets - 10 reps - Prone Alternating Arm and Leg Lifts  - 1 x daily - 7 x weekly - 2 sets - 10 reps - Supine Bridge  - 1 x daily - 7 x weekly - 2 sets - 10 reps - 5 hold - Supine Sciatic Nerve Glide  - 1 x daily - 7 x weekly - 1 sets - 10 reps - 1 hold - Supine Transversus Abdominis Bracing - Hands on Stomach  - 1 x daily - 7 x weekly - 1 sets - 15 reps - 3 hold - Supine Pelvic Tilt with Straight Leg Raise  - 1 x daily - 7 x weekly - 1 sets - 15 reps - 3 hold - Supine 90/90 Abdominal Bracing  - 1 x daily - 7 x weekly - 3 sets - 5-10 reps - 3 hold  ASSESSMENT: CLINICAL IMPRESSION: Introduced additional abdominal strengthening exs for core strengthening. Pt continues to respond positively to ext biased exs. Also, initiated proper lifting technique through the use o hinged hip sit to stand. Pt tolerated PT today without adverse effects.   EVAL: Patient is a 38 y.o. female who was seen today for physical therapy evaluation and treatment for low back. Pt presents to PT with radicular R LE pain, N/T, and weakness of the R hip flexion with myotome for L4. An assessment for directional preference for treatment was completed with pt responding positively to extension biased exs with reduction of R LE pain. Pt will benefit from skilled PT 2w6 to address impairments to optimize back/LE function with less pain.    OBJECTIVE IMPAIRMENTS: decreased activity tolerance, decreased strength, obesity, and pain.   ACTIVITY LIMITATIONS: lifting, sitting, standing, and locomotion level  PARTICIPATION LIMITATIONS: meal prep, cleaning, laundry, driving, shopping, community activity, and occupation  PERSONAL FACTORS: Fitness, Past/current experiences, Time since onset of injury/illness/exacerbation, and 1 comorbidity: high BMI are also affecting patient's functional outcome.   REHAB POTENTIAL: Good  CLINICAL DECISION MAKING:  Evolving/moderate complexity  EVALUATION COMPLEXITY: Moderate   GOALS:  SHORT TERM GOALS: Target date: 05/31/24  Pt will be Ind in an initial HEP  Baseline:started Goal status: INITIAL  2.  Pt will report 50% or greater decrease in pain with daily activities for improved function and QOL  Baseline:  Goal status: INITIAL  LONG TERM GOALS: Target date: 06/28/24  Pt will be Ind in a final HEP to maintain achieved LOF Baseline: started Goal status: INITIAL  2.  Pt will report 75% or greater decrease in pain with daily activities for improved function and QOL  Baseline:  Goal status: INITIAL  3.  Pt will demonstrate appropriate understanding of proper body mechanics with daily activities to minimize pain and strain Baseline:  Goal status: INITIAL  4.  Improve 5xSTS by MCID of 5 as indication of improved functional mobility Baseline: TBA Goal status: INITIAL  5.  Pt's Mod Oswestry score will improve by the MCID to 19% as indication of improved function  Baseline: 32% Goal status: INITIAL  PLAN:  PT FREQUENCY: 2x/week  PT DURATION: 6 weeks  PLANNED INTERVENTIONS: 97164- PT Re-evaluation, 97110-Therapeutic exercises, 97530- Therapeutic activity, 97112- Neuromuscular re-education, 97535- Self Care, 02859- Manual therapy, [id number]- Aquatic Therapy, H9716- Electrical stimulation (unattended), 20560 (1-2 muscles), 20561 (3+ muscles)- Dry Needling, Patient/Family education, Taping, Spinal mobilization, Cryotherapy, and Moist heat.  PLAN  FOR NEXT SESSION: Assess 5xSTS; assess response to HEP; progress therex as indicated; use of modalities, manual therapy; and TPDN as indicated.   Cortnie Ringel MS, PT 05/23/24 1:39 PM

## 2024-05-23 ENCOUNTER — Ambulatory Visit

## 2024-05-23 DIAGNOSIS — R262 Difficulty in walking, not elsewhere classified: Secondary | ICD-10-CM

## 2024-05-23 DIAGNOSIS — M5441 Lumbago with sciatica, right side: Secondary | ICD-10-CM | POA: Diagnosis not present

## 2024-05-23 DIAGNOSIS — G8929 Other chronic pain: Secondary | ICD-10-CM

## 2024-05-23 DIAGNOSIS — M6281 Muscle weakness (generalized): Secondary | ICD-10-CM

## 2024-05-27 NOTE — Therapy (Signed)
 OUTPATIENT PHYSICAL THERAPY THORACOLUMBAR TREATMENT   Patient Name: Vanessa Bennett MRN: [id number] DOB:1985-09-20, 38 y.o., female Today's Date: 05/28/2024  END OF SESSION:  PT End of Session - 05/28/24 0955     Visit Number 5    Number of Visits 12    Date for Recertification  06/28/24    Authorization Type [place] MEDICAID [facility]    Authorization Time Period Approved 10 PT visits from 05/07/24-07/06/24    Authorization - Visit Number 5    Authorization - Number of Visits 10    PT Start Time 0933    PT Stop Time 1015    PT Time Calculation (min) 42 min    Activity Tolerance Patient tolerated treatment well    Behavior During Therapy [facility] for tasks assessed/performed             Past Medical History:  Diagnosis Date   ADHD    Carpal tunnel syndrome    Eczema    Sciatica    Past Surgical History:  Procedure Laterality Date   CHOLECYSTECTOMY  2010   Patient Active Problem List   Diagnosis Date Noted   History of prediabetes 09/12/2022   Healthcare maintenance 09/12/2022   Thyroid  function test abnormal 09/12/2022   Acute right-sided low back pain with right-sided sciatica 08/31/2022    PCP: Cesario Boer, MD   REFERRING PROVIDER: Cesario Boer, MD   REFERRING DIAG: Low back  Rationale for Evaluation and Treatment: Rehabilitation  THERAPY DIAG:  Chronic right-sided low back pain with right-sided sciatica  Difficulty in walking, not elsewhere classified  Muscle weakness (generalized)  ONSET DATE: July  SUBJECTIVE:                                                                                                                                                                                           SUBJECTIVE STATEMENT: Pt reports no low back or radiating pain into her R leg. She does note intermittent medial knee pain c certain knee movements. Pt est overall improvement of 50%.  EVAL: Pt reports developing R low back, buttock, and post/lat R LE pain around July  without an obvious incident. Now the pain is better, but she can develop significant pain esp with sitting/driving. Pt endorses N/T of her R lateral calf and the bottom of the medial 3 toes.   PERTINENT HISTORY:  High BMI  PAIN:  Are you having pain? Yes: NPRS scale: 1-6/10. Currently:0/10 intermittently Pain location: R low back, buttock, and post/lat R LE Pain description: intermittent, sharp, throbbing Aggravating factors: Sitting, prolonged standing/wwalking Relieving factors: The pain will stop on it own rest  PRECAUTIONS: None  RED FLAGS: None   WEIGHT BEARING RESTRICTIONS: No  FALLS:  Has patient fallen in last 6 months? No  LIVING ENVIRONMENT: Lives with: lives with their family Lives in: House/apartment Able to access home  OCCUPATION: [employer], PCA  PLOF: Independent  PATIENT GOALS: Less pain   OBJECTIVE:  Note: Objective measures were completed at Evaluation unless otherwise noted.  DIAGNOSTIC FINDINGS:  02/29/24 IMPRESSION: 1. No acute abnormality in lumbar spine. 2. Facet arthropathy at L4-L5.  PATIENT SURVEYS:  Modified Oswestry: 16/50=32%  Interpretation of scores: Score Category Description  0-20% Minimal Disability The patient can cope with most living activities. Usually no treatment is indicated apart from advice on lifting, sitting and exercise  21-40% Moderate Disability The patient experiences more pain and difficulty with sitting, lifting and standing. Travel and social life are more difficult and they may be disabled from work. Personal care, sexual activity and sleeping are not grossly affected, and the patient can usually be managed by conservative means  41-60% Severe Disability Pain remains the main problem in this group, but activities of daily living are affected. These patients require a detailed investigation  61-80% Crippled Back pain impinges on all aspects of the patient's life. Positive intervention is required   81-100% Bed-bound These patients are either bed-bound or exaggerating their symptoms   Minimally Clinically Important Difference (MCID) = 12.8%  COGNITION: Overall cognitive status: Within functional limits for tasks assessed     SENSATION: WFL  MUSCLE LENGTH: Hamstrings: Right WNLs deg; Left WNLs deg Debby test: Right WNLs deg; Left WNLs deg  POSTURE: rounded shoulders, forward head, and genu valgum  PALPATION: Not TTP  LUMBAR ROM:   AROM eval  Flexion Full, min increase pulling  Extension Full, min pressure pain  Right lateral flexion Full, min pressure pain  Left lateral flexion Full, no pain  Right rotation Full  Left rotation Full   (Blank rows = not tested)  LOWER EXTREMITY ROM:    Grossly WNLs Active  Right eval Left eval  Hip flexion    Hip extension    Hip abduction    Hip adduction    Hip internal rotation    Hip external rotation    Knee flexion    Knee extension    Ankle dorsiflexion    Ankle plantarflexion    Ankle inversion    Ankle eversion     (Blank rows = not tested)  LOWER EXTREMITY MMT:    MMT Right eval Left eval  Hip flexion 3+ 4+  Hip extension    Hip abduction    Hip adduction    Hip internal rotation    Hip external rotation    Knee flexion 5 5  Knee extension 5 5  Ankle dorsiflexion 5 5  Ankle plantarflexion 5 5  Ankle inversion    Ankle eversion     (Blank rows = not tested)  LUMBAR SPECIAL TESTS:  Straight leg raise test: Negative and Slump test: Negative  FUNCTIONAL TESTS:  5 times sit to stand: TBA 11.1 sec 5 x STS 05/14/24  GAIT: Distance walked: 200' Assistive device utilized: None Level of assistance: Complete Independence Comments: WNLs  TREATMENT DATE: [facility] Adult PT  [facility] Adult PT Treatment:  DATE: 05/28/24 Therapeutic Exercise/Activity: Supine sciatic nerve glide x10 SLR c quad set 2x10 c AB bracingeach Bridge 2 x 10 TA bracing x10 90/90 AB  bracing x5 10 SLR x 15 with ab brace  POE Press up x10 with hips stabilized by pt Therapeutic Activity: Hinged hip sit to standing 15# Hinged hip lifting x10 15# Dead lifts c hinged hip x10 15# Shoulder rows x15 BluTB Shoulder ext x15 BluTB  [facility] Adult PT Treatment:                                                DATE: 08/24/23 Therapeutic Exercise/Activity: Supine sciatic nerve glide x10 Bridge 2 x 10 TA bracing x10 90/90 AB bracing x5 10 SLR x 15 with ab brace  Prone lying POE Press up x10 with hips stabilized by pt Prone Alt UE /LE x 10  Hinged hip lifting c dowel x10, s dowel, c 15# Updated HEP  PATIENT EDUCATION:  Education details: Eval findings, POC, HEP, self care  Person educated: Patient Education method: Explanation, Demonstration, Tactile cues, Verbal cues, and Handouts Education comprehension: verbalized understanding, returned demonstration, verbal cues required, and tactile cues required  HOME EXERCISE PROGRAM: Access Code: [email or web address] URL: [email or web address] Date: 05/23/2024 Prepared by: Dasie Daft  Exercises - Lying Prone  - 4 x daily - 7 x weekly - 1 sets - 1 reps - 2-5 hold - Static Prone on Elbows  - 4 x daily - 7 x weekly - 1 sets - 10 reps - 2-5 hold - Prone Press Up  - 4 x daily - 7 x weekly - 1 sets - 10 reps - 1 hold - Sidelying Hip Abduction  - 1 x daily - 7 x weekly - 2 sets - 10 reps - Prone Alternating Arm and Leg Lifts  - 1 x daily - 7 x weekly - 2 sets - 10 reps - Supine Bridge  - 1 x daily - 7 x weekly - 2 sets - 10 reps - 5 hold - Supine Sciatic Nerve Glide  - 1 x daily - 7 x weekly - 1 sets - 10 reps - 1 hold - Supine Transversus Abdominis Bracing - Hands on Stomach  - 1 x daily - 7 x weekly - 1 sets - 15 reps - 3 hold - Supine Pelvic Tilt with Straight Leg Raise  - 1 x daily - 7 x weekly - 1 sets - 15 reps - 3 hold - Supine 90/90 Abdominal Bracing  - 1 x daily - 7 x weekly - 3 sets - 5-10 reps - 3  hold  ASSESSMENT: CLINICAL IMPRESSION: Continued to progress core strengthening and to work on proper lifting using a hinged hip and abdominal bracing. Pt demonstrates good understanding of her HEP and reports good improvement in her low back and radicular R leg pain. Pt has a tendency to round out her back c lifting. Will continue to work on. Pt tolerated PT today without adverse effects.   EVAL: Patient is a 38 y.o. female who was seen today for physical therapy evaluation and treatment for low back. Pt presents to PT with radicular R LE pain, N/T, and weakness of the R hip flexion with myotome for L4. An assessment for directional preference for treatment was completed with pt responding positively to extension biased exs with reduction of R LE pain. Pt  will benefit from skilled PT 2w6 to address impairments to optimize back/LE function with less pain.    OBJECTIVE IMPAIRMENTS: decreased activity tolerance, decreased strength, obesity, and pain.   ACTIVITY LIMITATIONS: lifting, sitting, standing, and locomotion level  PARTICIPATION LIMITATIONS: meal prep, cleaning, laundry, driving, shopping, community activity, and occupation  PERSONAL FACTORS: Fitness, Past/current experiences, Time since onset of injury/illness/exacerbation, and 1 comorbidity: high BMI are also affecting patient's functional outcome.   REHAB POTENTIAL: Good  CLINICAL DECISION MAKING: Evolving/moderate complexity  EVALUATION COMPLEXITY: Moderate   GOALS:  SHORT TERM GOALS: Target date: 05/31/24  Pt will be Ind in an initial HEP  Baseline:started 05/28/24; pt is completing correctly Goal status: MET  2.  Pt will report 50% or greater decrease in pain with daily activities for improved function and QOL  Baseline:  05/28/24: est 50% improvement Goal status: MET  LONG TERM GOALS: Target date: 06/28/24  Pt will be Ind in a final HEP to maintain achieved LOF Baseline: started Goal status: INITIAL  2.  Pt will  report 75% or greater decrease in pain with daily activities for improved function and QOL  Baseline:  Goal status: INITIAL  3.  Pt will demonstrate appropriate understanding of proper body mechanics with daily activities to minimize pain and strain Baseline:  Goal status: INITIAL  4.  Improve 5xSTS by MCID of 5 as indication of improved functional mobility Baseline: TBA Goal status: INITIAL  5.  Pt's Mod Oswestry score will improve by the MCID to 19% as indication of improved function  Baseline: 32% Goal status: INITIAL  PLAN:  PT FREQUENCY: 2x/week  PT DURATION: 6 weeks  PLANNED INTERVENTIONS: 97164- PT Re-evaluation, 97110-Therapeutic exercises, 97530- Therapeutic activity, 97112- Neuromuscular re-education, 97535- Self Care, 02859- Manual therapy, [id number]- Aquatic Therapy, H9716- Electrical stimulation (unattended), 20560 (1-2 muscles), 20561 (3+ muscles)- Dry Needling, Patient/Family education, Taping, Spinal mobilization, Cryotherapy, and Moist heat.  PLAN FOR NEXT SESSION: Assess 5xSTS; assess response to HEP; progress therex as indicated; use of modalities, manual therapy; and TPDN as indicated.   Akshay Spang MS, PT 05/28/24 10:19 AM

## 2024-05-28 ENCOUNTER — Ambulatory Visit: Attending: Physical Medicine and Rehabilitation

## 2024-05-28 DIAGNOSIS — G8929 Other chronic pain: Secondary | ICD-10-CM | POA: Insufficient documentation

## 2024-05-28 DIAGNOSIS — R262 Difficulty in walking, not elsewhere classified: Secondary | ICD-10-CM | POA: Diagnosis present

## 2024-05-28 DIAGNOSIS — M5441 Lumbago with sciatica, right side: Secondary | ICD-10-CM | POA: Insufficient documentation

## 2024-05-28 DIAGNOSIS — M6281 Muscle weakness (generalized): Secondary | ICD-10-CM | POA: Diagnosis present

## 2024-05-28 NOTE — Therapy (Signed)
 OUTPATIENT PHYSICAL THERAPY THORACOLUMBAR TREATMENT   Patient Name: Vanessa Bennett MRN: [id number] DOB:Aug 13, 1985, 38 y.o., female Today's Date: 05/30/2024  END OF SESSION:  PT End of Session - 05/30/24 0943     Visit Number 6    Number of Visits 12    Date for Recertification  06/28/24    Authorization Type [place] MEDICAID [facility]    Authorization Time Period Approved 10 PT visits from 05/07/24-07/06/24    Authorization - Visit Number 6    Authorization - Number of Visits 10    PT Start Time [phone]    PT Stop Time 1020    PT Time Calculation (min) 43 min    Activity Tolerance Patient tolerated treatment well    Behavior During Therapy [facility] for tasks assessed/performed              Past Medical History:  Diagnosis Date   ADHD    Carpal tunnel syndrome    Eczema    Sciatica    Past Surgical History:  Procedure Laterality Date   CHOLECYSTECTOMY  2010   Patient Active Problem List   Diagnosis Date Noted   History of prediabetes 09/12/2022   Healthcare maintenance 09/12/2022   Thyroid  function test abnormal 09/12/2022   Acute right-sided low back pain with right-sided sciatica 08/31/2022    PCP: Cesario Boer, MD   REFERRING PROVIDER: Cesario Boer, MD   REFERRING DIAG: Low back  Rationale for Evaluation and Treatment: Rehabilitation  THERAPY DIAG:  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 Pt notices r leg pain when driving.  EVAL: Pt reports developing R low back, buttock, and post/lat R LE pain around July without an obvious incident. Now the  pain is better, but she can develop significant pain esp with sitting/driving. Pt endorses N/T of her R lateral calf and the bottom of the medial 3 toes.   PERTINENT HISTORY:  High BMI  PAIN:  Are you having pain? Yes: NPRS scale: 1-6/10. Currently:0/10 intermittently Pain location: R low back, buttock, and post/lat R LE Pain description: intermittent, sharp, throbbing Aggravating factors: Sitting, prolonged standing/wwalking Relieving factors: The pain will stop on it own rest  PRECAUTIONS: None  RED FLAGS: None  WEIGHT BEARING RESTRICTIONS: No  FALLS:  Has patient fallen in last 6 months? No  LIVING ENVIRONMENT: Lives with: lives with their family Lives in: House/apartment Able to access home  OCCUPATION: [employer], PCA  PLOF: Independent  PATIENT GOALS: Less pain   OBJECTIVE:  Note: Objective measures were completed at Evaluation unless otherwise noted.  DIAGNOSTIC FINDINGS:  02/29/24 IMPRESSION: 1. No acute abnormality in lumbar spine. 2. Facet arthropathy at L4-L5.  PATIENT SURVEYS:  Modified Oswestry: 16/50=32%  Interpretation of scores: Score Category Description  0-20% Minimal Disability The patient can cope with most living activities. Usually no treatment is indicated apart from advice on lifting, sitting and exercise  21-40% Moderate Disability The patient experiences more pain and difficulty with sitting, lifting and standing. Travel and social life are more difficult and they may be disabled from work. Personal care, sexual activity and sleeping are not grossly affected, and the patient can usually be managed by conservative means  41-60% Severe Disability Pain remains the main problem in this group, but activities of daily living are affected. These patients require a detailed investigation  61-80% Crippled Back pain impinges on all aspects of the patient's life. Positive intervention is required  81-100% Bed-bound These patients are  either bed-bound or exaggerating their symptoms   Minimally Clinically Important Difference (MCID) = 12.8%  COGNITION: Overall cognitive status: Within functional limits for tasks assessed     SENSATION: WFL  MUSCLE LENGTH: Hamstrings: Right WNLs deg; Left WNLs deg Debby test: Right WNLs deg; Left WNLs deg  POSTURE: rounded shoulders, forward head, and genu valgum  PALPATION: Not TTP  LUMBAR ROM:   AROM eval  Flexion Full, min increase pulling  Extension Full, min pressure pain  Right lateral flexion Full, min pressure pain  Left lateral flexion Full, no pain  Right rotation Full  Left rotation Full   (Blank rows = not tested)  LOWER EXTREMITY ROM:    Grossly WNLs Active  Right eval Left eval  Hip flexion    Hip extension    Hip abduction    Hip adduction    Hip internal rotation    Hip external rotation    Knee flexion    Knee extension    Ankle dorsiflexion    Ankle plantarflexion    Ankle inversion    Ankle eversion     (Blank rows = not tested)  LOWER EXTREMITY MMT:    MMT Right eval Left eval  Hip flexion 3+ 4+  Hip extension    Hip abduction    Hip adduction    Hip internal rotation    Hip external rotation    Knee flexion 5 5  Knee extension 5 5  Ankle dorsiflexion 5 5  Ankle plantarflexion 5 5  Ankle inversion    Ankle eversion     (Blank rows = not tested)  LUMBAR SPECIAL TESTS:  Straight leg raise test: Negative and Slump test: Negative  FUNCTIONAL TESTS:  5 times sit to stand: TBA 11.1 sec 5 x STS 05/14/24  GAIT: Distance walked: 200' Assistive device utilized: None Level of assistance: Complete Independence Comments: WNLs  TREATMENT DATE: [facility] Adult PT Treatment:                                                DATE: 05/30/24 Therapeutic Exercise/Activity: Supine sciatic nerve  glide x10 SLR c quad set 2x10 c AB bracing each 90/90 AB bracing x5 10 Bridge 2 x 10 POE Press up x10 with hips stabilized by pt Quad hip ext  2x10 Plank- elbows and toes x10 5 Hinged hip sit to standing c dowel for feedback Hinged hip lifting x10 20# Self Care: Use of lumbar roll or lumbar support in car for management of sciatic pain   [facility] Adult PT Treatment:                                                DATE: 05/28/24 DATE: 05/28/24 Therapeutic Exercise/Activity: Supine sciatic nerve glide x10 SLR c quad set 2x10 c AB bracingeach Bridge 2 x 10 TA bracing x10 90/90 AB bracing x5 10 SLR x 15 with ab brace  POE Press up x10 with hips stabilized by pt Therapeutic Activity: Hinged hip sit to standing 15# Hinged hip lifting x10 15# Dead lifts c hinged hip x10 15# Shoulder rows x15 BluTB Shoulder ext x15 BluTB   PATIENT EDUCATION:  Education details: Eval findings, POC, HEP, self care  Person educated: Patient Education method: Explanation, Demonstration, Tactile cues, Verbal cues, and Handouts Education comprehension: verbalized understanding, returned demonstration, verbal cues required, and tactile cues required  HOME EXERCISE PROGRAM: Access Code: [email or web address] URL: [email or web address] Date: 05/23/2024 Prepared by: Dasie Daft  Exercises - Lying Prone  - 4 x daily - 7 x weekly - 1 sets - 1 reps - 2-5 hold - Static Prone on Elbows  - 4 x daily - 7 x weekly - 1 sets - 10 reps - 2-5 hold - Prone Press Up  - 4 x daily - 7 x weekly - 1 sets - 10 reps - 1 hold - Sidelying Hip Abduction  - 1 x daily - 7 x weekly - 2 sets - 10 reps - Prone Alternating Arm and Leg Lifts  - 1 x daily - 7 x weekly - 2 sets - 10 reps - Supine Bridge  - 1 x daily - 7 x weekly - 2 sets - 10 reps - 5 hold - Supine Sciatic Nerve Glide  - 1 x daily - 7 x weekly - 1 sets - 10 reps - 1 hold - Supine Transversus Abdominis Bracing - Hands on Stomach  - 1 x daily - 7 x weekly - 1 sets - 15 reps - 3 hold - Supine Pelvic Tilt with Straight Leg Raise  - 1 x daily - 7 x weekly - 1 sets - 15 reps - 3 hold - Supine 90/90 Abdominal Bracing   - 1 x daily - 7 x weekly - 3 sets - 5-10 reps - 3 hold  ASSESSMENT: CLINICAL IMPRESSION: Instructed pt in proper sitting c lumbar lordosis assisted by towel roll or car seat adjustment for R sciatic pain management. Continued lumbopelvic flexibility and strengthening with extension bias. Pt demonstrated improved technique for hinged hip lifting. Pt tolerated PT today without adverse effects. Pt will continue to benefit from skilled PT to address impairments for improved back function with minimized pain.    EVAL: Patient is a 38 y.o. female who was seen today for physical therapy evaluation and treatment for low back. Pt presents to PT with radicular R LE pain, N/T, and weakness of the R hip flexion with myotome for L4. An assessment for directional preference for treatment  was completed with pt responding positively to extension biased exs with reduction of R LE pain. Pt will benefit from skilled PT 2w6 to address impairments to optimize back/LE function with less pain.    OBJECTIVE IMPAIRMENTS: decreased activity tolerance, decreased strength, obesity, and pain.   ACTIVITY LIMITATIONS: lifting, sitting, standing, and locomotion level  PARTICIPATION LIMITATIONS: meal prep, cleaning, laundry, driving, shopping, community activity, and occupation  PERSONAL FACTORS: Fitness, Past/current experiences, Time since onset of injury/illness/exacerbation, and 1 comorbidity: high BMI are also affecting patient's functional outcome.   REHAB POTENTIAL: Good  CLINICAL DECISION MAKING: Evolving/moderate complexity  EVALUATION COMPLEXITY: Moderate   GOALS:  SHORT TERM GOALS: Target date: 05/31/24  Pt will be Ind in an initial HEP  Baseline:started 05/28/24; pt is completing correctly Goal status: MET  2.  Pt will report 50% or greater decrease in pain with daily activities for improved function and QOL  Baseline:  05/28/24: est 50% improvement Goal status: MET  LONG TERM GOALS: Target date:  06/28/24  Pt will be Ind in a final HEP to maintain achieved LOF Baseline: started Goal status: ONGOING  2.  Pt will report 75% or greater decrease in pain with daily activities for improved function and QOL  Baseline:  Goal status: INITIAL  3.  Pt will demonstrate appropriate understanding of proper body mechanics with daily activities to minimize pain and strain Baseline:  Goal status: Ongoing  4.  Improve 5xSTS by MCID of 5 as indication of improved functional mobility Baseline: TBA Goal status: INITIAL  5.  Pt's Mod Oswestry score will improve by the MCID to 19% as indication of improved function  Baseline: 32% Goal status: INITIAL  PLAN:  PT FREQUENCY: 2x/week  PT DURATION: 6 weeks  PLANNED INTERVENTIONS: 97164- PT Re-evaluation, 97110-Therapeutic exercises, 97530- Therapeutic activity, 97112- Neuromuscular re-education, 97535- Self Care, 02859- Manual therapy, [id number]- Aquatic Therapy, H9716- Electrical stimulation (unattended), 20560 (1-2 muscles), 20561 (3+ muscles)- Dry Needling, Patient/Family education, Taping, Spinal mobilization, Cryotherapy, and Moist heat.  PLAN FOR NEXT SESSION: Assess 5xSTS; assess response to HEP; progress therex as indicated; use of modalities, manual therapy; and TPDN as indicated.   Cayde Held MS, PT 05/30/24 10:41 AM

## 2024-05-30 ENCOUNTER — Ambulatory Visit

## 2024-05-30 DIAGNOSIS — M5441 Lumbago with sciatica, right side: Secondary | ICD-10-CM | POA: Diagnosis not present

## 2024-05-30 DIAGNOSIS — M6281 Muscle weakness (generalized): Secondary | ICD-10-CM

## 2024-05-30 DIAGNOSIS — G8929 Other chronic pain: Secondary | ICD-10-CM

## 2024-05-30 DIAGNOSIS — R262 Difficulty in walking, not elsewhere classified: Secondary | ICD-10-CM

## 2024-06-03 NOTE — Therapy (Signed)
 OUTPATIENT PHYSICAL THERAPY THORACOLUMBAR TREATMENT   Patient Name: Vanessa Bennett MRN: [id number] DOB:09-17-1985, 38 y.o., female Today's Date: 06/04/2024  END OF SESSION:  PT End of Session - 06/04/24 0929     Visit Number 7    Number of Visits 12    Date for Recertification  06/28/24    Authorization Type [place] MEDICAID [facility]    Authorization Time Period Approved 10 PT visits from 05/07/24-07/06/24    Authorization - Visit Number 7    Authorization - Number of Visits 10    PT Start Time 0930    PT Stop Time 1013    PT Time Calculation (min) 43 min    Activity Tolerance Patient tolerated treatment well    Behavior During Therapy [facility] for tasks assessed/performed               Past Medical History:  Diagnosis Date   ADHD    Carpal tunnel syndrome    Eczema    Sciatica    Past Surgical History:  Procedure Laterality Date   CHOLECYSTECTOMY  2010   Patient Active Problem List   Diagnosis Date Noted   History of prediabetes 09/12/2022   Healthcare maintenance 09/12/2022   Thyroid  function test abnormal 09/12/2022   Acute right-sided low back pain with right-sided sciatica 08/31/2022    PCP: Cesario Boer, MD   REFERRING PROVIDER: Cesario Boer, MD   REFERRING DIAG: Low back  Rationale for Evaluation and Treatment: Rehabilitation  THERAPY DIAG:  Chronic right-sided low back pain with right-sided sciatica  Difficulty in walking, not elsewhere classified  Muscle weakness (generalized)  ONSET DATE: July  SUBJECTIVE:                                                                                                                                                                                           SUBJECTIVE STATEMENT: Pt reports R posterior thigh pain yesterday and this AM. Seems more noticeable with the much colder weather. Pt notes she has made an adjustment with the lumbar support with her car.  EVAL: Pt reports developing R low back, buttock, and post/lat  R LE pain around July without an obvious incident. Now the pain is better, but she can develop significant pain esp with sitting/driving. Pt endorses N/T of her R lateral calf and the bottom of the medial 3 toes.   PERTINENT HISTORY:  High BMI  PAIN:  Are you having pain? Yes: NPRS scale: 1-6/10. Currently:0/10 intermittently Pain location: R low back, buttock, and post/lat R LE Pain description: intermittent, sharp, throbbing Aggravating factors: Sitting, prolonged standing/wwalking Relieving factors: The pain will stop on it  own rest  PRECAUTIONS: None  RED FLAGS: None   WEIGHT BEARING RESTRICTIONS: No  FALLS:  Has patient fallen in last 6 months? No  LIVING ENVIRONMENT: Lives with: lives with their family Lives in: House/apartment Able to access home  OCCUPATION: [employer], PCA  PLOF: Independent  PATIENT GOALS: Less pain   OBJECTIVE:  Note: Objective measures were completed at Evaluation unless otherwise noted.  DIAGNOSTIC FINDINGS:  02/29/24 IMPRESSION: 1. No acute abnormality in lumbar spine. 2. Facet arthropathy at L4-L5.  PATIENT SURVEYS:  Modified Oswestry: 16/50=32%  Interpretation of scores: Score Category Description  0-20% Minimal Disability The patient can cope with most living activities. Usually no treatment is indicated apart from advice on lifting, sitting and exercise  21-40% Moderate Disability The patient experiences more pain and difficulty with sitting, lifting and standing. Travel and social life are more difficult and they may be disabled from work. Personal care, sexual activity and sleeping are not grossly affected, and the patient can usually be managed by conservative means  41-60% Severe Disability Pain remains the main problem in this group, but activities of daily living are affected. These patients require a detailed investigation  61-80% Crippled Back pain impinges on all aspects of the patient's life. Positive  intervention is required  81-100% Bed-bound These patients are either bed-bound or exaggerating their symptoms   Minimally Clinically Important Difference (MCID) = 12.8%  COGNITION: Overall cognitive status: Within functional limits for tasks assessed     SENSATION: WFL  MUSCLE LENGTH: Hamstrings: Right WNLs deg; Left WNLs deg Debby test: Right WNLs deg; Left WNLs deg  POSTURE: rounded shoulders, forward head, and genu valgum  PALPATION: Not TTP  LUMBAR ROM:   AROM eval  Flexion Full, min increase pulling  Extension Full, min pressure pain  Right lateral flexion Full, min pressure pain  Left lateral flexion Full, no pain  Right rotation Full  Left rotation Full   (Blank rows = not tested)  LOWER EXTREMITY ROM:    Grossly WNLs Active  Right eval Left eval  Hip flexion    Hip extension    Hip abduction    Hip adduction    Hip internal rotation    Hip external rotation    Knee flexion    Knee extension    Ankle dorsiflexion    Ankle plantarflexion    Ankle inversion    Ankle eversion     (Blank rows = not tested)  LOWER EXTREMITY MMT:    MMT Right eval Left eval  Hip flexion 3+ 4+  Hip extension    Hip abduction    Hip adduction    Hip internal rotation    Hip external rotation    Knee flexion 5 5  Knee extension 5 5  Ankle dorsiflexion 5 5  Ankle plantarflexion 5 5  Ankle inversion    Ankle eversion     (Blank rows = not tested)  LUMBAR SPECIAL TESTS:  Straight leg raise test: Negative and Slump test: Negative  FUNCTIONAL TESTS:  5 times sit to stand: TBA 11.1 sec 5 x STS 05/14/24  GAIT: Distance walked: 200' Assistive device utilized: None Level of assistance: Complete Independence Comments: WNLs  TREATMENT DATE: [facility] Adult PT Treatment:  DATE: 06/04/24 Therapeutic Exercise/Activity: Nustep 6 mins L5  UE/LE Supine sciatic nerve glide x10 SLR c quad set 2x10 c AB bracing each 90/90 AB  bracing x5 10 Piriformis pull and push Bridge 2 x 10 POE Press up x10 with hips stabilized by pt %xSTS Hinged hip sit to standing c dowel for feedback Hinged hip lifting 2x10 20# Self Care: Rest breaks every hour when driving long distances to manage sciatic pain  [facility] Adult PT Treatment:                                                DATE: 05/30/24 Therapeutic Exercise/Activity: Supine sciatic nerve glide x10 SLR c quad set 2x10 c AB bracing each 90/90 AB bracing x5 10 Bridge 2 x 10 POE Press up x10 with hips stabilized by pt Quad hip ext 2x10 Plank- elbows and toes x10 5 Hinged hip sit to standing c dowel for feedback Hinged hip lifting x10 20# Self Care: Use of lumbar roll or lumbar support in car for management of sciatic pain   [facility] Adult PT Treatment:                                                DATE: 05/28/24 DATE: 05/28/24 Therapeutic Exercise/Activity: Supine sciatic nerve glide x10 SLR c quad set 2x10 c AB bracingeach Bridge 2 x 10 TA bracing x10 90/90 AB bracing x5 10 SLR x 15 with ab brace  POE Press up x10 with hips stabilized by pt Therapeutic Activity: Hinged hip sit to standing 15# Hinged hip lifting x10 15# Dead lifts c hinged hip x10 15# Shoulder rows x15 BluTB Shoulder ext x15 BluTB   PATIENT EDUCATION:  Education details: Eval findings, POC, HEP, self care  Person educated: Patient Education method: Explanation, Demonstration, Tactile cues, Verbal cues, and Handouts Education comprehension: verbalized understanding, returned demonstration, verbal cues required, and tactile cues required  HOME EXERCISE PROGRAM: Access Code: [email or web address] URL: [email or web address] Date: 06/04/2024 Prepared by: Dasie Daft  Exercises - Lying Prone  - 4 x daily - 7 x weekly - 1 sets - 1 reps - 2-5 hold - Static Prone on Elbows  - 4 x daily - 7 x weekly - 1 sets - 10 reps - 2-5 hold - Prone Press Up  - 4 x daily - 7 x weekly - 1 sets - 10 reps -  1 hold - Sidelying Hip Abduction  - 1 x daily - 7 x weekly - 2 sets - 10 reps - Prone Alternating Arm and Leg Lifts  - 1 x daily - 7 x weekly - 2 sets - 10 reps - Supine Bridge  - 1 x daily - 7 x weekly - 2 sets - 10 reps - 5 hold - Supine Sciatic Nerve Glide  - 1 x daily - 7 x weekly - 1 sets - 10 reps - 1 hold - Supine Transversus Abdominis Bracing - Hands on Stomach  - 1 x daily - 7 x weekly - 1 sets - 15 reps - 3 hold - Supine Pelvic Tilt with Straight Leg Raise  - 1 x daily - 7 x weekly - 1 sets - 15 reps - 3 hold - Supine 90/90 Abdominal Bracing  -  1 x daily - 7 x weekly - 3 sets - 5-10 reps - 3 hold - Supine Piriformis Stretch with Foot on Ground  - 1 x daily - 7 x weekly - 1 sets - 3 reps - 30 hold - Supine Figure 4 Piriformis Stretch  - 1 x daily - 7 x weekly - 1 sets - 3 reps - 30 hold  ASSESSMENT: CLINICAL IMPRESSION: 5xSTS is improved and is at an appropriate level. Sciatic pain continues to make progress. Pt is demonstrating better understanding of proper sitting and hinged hip lifting. Overall, pt has made good progress. Added piriformis stretches to address possible sciatic irritation. Pt feels more tightness through the R gluteal area, than L. Pt tolerated PT today without adverse effects.    EVAL: Patient is a 38 y.o. female who was seen today for physical therapy evaluation and treatment for low back. Pt presents to PT with radicular R LE pain, N/T, and weakness of the R hip flexion with myotome for L4. An assessment for directional preference for treatment was completed with pt responding positively to extension biased exs with reduction of R LE pain. Pt will benefit from skilled PT 2w6 to address impairments to optimize back/LE function with less pain.    OBJECTIVE IMPAIRMENTS: decreased activity tolerance, decreased strength, obesity, and pain.   ACTIVITY LIMITATIONS: lifting, sitting, standing, and locomotion level  PARTICIPATION LIMITATIONS: meal prep, cleaning, laundry,  driving, shopping, community activity, and occupation  PERSONAL FACTORS: Fitness, Past/current experiences, Time since onset of injury/illness/exacerbation, and 1 comorbidity: high BMI are also affecting patient's functional outcome.   REHAB POTENTIAL: Good  CLINICAL DECISION MAKING: Evolving/moderate complexity  EVALUATION COMPLEXITY: Moderate   GOALS:  SHORT TERM GOALS: Target date: 05/31/24  Pt will be Ind in an initial HEP  Baseline:started 05/28/24; pt is completing correctly Goal status: MET  2.  Pt will report 50% or greater decrease in pain with daily activities for improved function and QOL  Baseline:  05/28/24: est 50% improvement Goal status: MET  LONG TERM GOALS: Target date: 06/28/24  Pt will be Ind in a final HEP to maintain achieved LOF Baseline: started Goal status: ONGOING  2.  Pt will report 75% or greater decrease in pain with daily activities for improved function and QOL  Baseline:  06/04/24: Pt est 65% improved Goal status: INITIAL  3.  Pt will demonstrate appropriate understanding of proper body mechanics with daily activities to minimize pain and strain Baseline:  Goal status: IMPROVED  4.  Improve 5xSTS by MCID of 5 as indication of improved functional mobility Baseline: 05/14/24  11.1 06/04/24: 9.6 Goal status: MET: at appropriate level  5.  Pt's Mod Oswestry score will improve by the MCID to 19% as indication of improved function  Baseline: 32% Goal status: INITIAL  PLAN:  PT FREQUENCY: 2x/week  PT DURATION: 6 weeks  PLANNED INTERVENTIONS: 97164- PT Re-evaluation, 97110-Therapeutic exercises, 97530- Therapeutic activity, 97112- Neuromuscular re-education, 97535- Self Care, 02859- Manual therapy, [id number]- Aquatic Therapy, H9716- Electrical stimulation (unattended), 20560 (1-2 muscles), 20561 (3+ muscles)- Dry Needling, Patient/Family education, Taping, Spinal mobilization, Cryotherapy, and Moist heat.  PLAN FOR NEXT SESSION: Assess  5xSTS; assess response to HEP; progress therex as indicated; use of modalities, manual therapy; and TPDN as indicated.   Arvin Abello MS, PT 06/04/24 10:25 AM

## 2024-06-04 ENCOUNTER — Ambulatory Visit

## 2024-06-04 DIAGNOSIS — M5441 Lumbago with sciatica, right side: Secondary | ICD-10-CM | POA: Diagnosis not present

## 2024-06-04 DIAGNOSIS — M6281 Muscle weakness (generalized): Secondary | ICD-10-CM

## 2024-06-04 DIAGNOSIS — R262 Difficulty in walking, not elsewhere classified: Secondary | ICD-10-CM

## 2024-06-04 DIAGNOSIS — G8929 Other chronic pain: Secondary | ICD-10-CM

## 2024-06-05 NOTE — Therapy (Signed)
 OUTPATIENT PHYSICAL THERAPY THORACOLUMBAR TREATMENT   Patient Name: Vanessa Bennett MRN: [id number] DOB:08/23/1985, 38 y.o., female Today's Date: 06/06/2024  END OF SESSION:  PT End of Session - 06/06/24 0942     Visit Number 8    Number of Visits 12    Date for Recertification  06/28/24    Authorization Type [place] MEDICAID [facility]    Authorization Time Period Approved 10 PT visits from 05/07/24-07/06/24    Authorization - Visit Number 8    Authorization - Number of Visits 10    PT Start Time [phone]    PT Stop Time 1015    PT Time Calculation (min) 41 min    Activity Tolerance Patient tolerated treatment well    Behavior During Therapy [facility] for tasks assessed/performed                Past Medical History:  Diagnosis Date   ADHD    Carpal tunnel syndrome    Eczema    Sciatica    Past Surgical History:  Procedure Laterality Date   CHOLECYSTECTOMY  2010   Patient Active Problem List   Diagnosis Date Noted   History of prediabetes 09/12/2022   Healthcare maintenance 09/12/2022   Thyroid  function test abnormal 09/12/2022   Acute right-sided low back pain with right-sided sciatica 08/31/2022    PCP: Cesario Boer, MD   REFERRING PROVIDER: Cesario Boer, MD   REFERRING DIAG: Low back  Rationale for Evaluation and Treatment: Rehabilitation  THERAPY DIAG:  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 Notes N/T is still present in th R leg   EVAL: Pt reports developing R low back, buttock, and post/lat R LE pain around July without an obvious incident. Now the pain is better, but she can  develop significant pain esp with sitting/driving. Pt endorses N/T of her R lateral calf and the bottom of the medial 3 toes.   PERTINENT HISTORY:  High BMI  PAIN:  Are you having pain? Yes: NPRS scale: 1-6/10. Currently:0/10 intermittently Pain location: R low back, buttock, and post/lat R LE Pain description: intermittent, sharp, throbbing Aggravating factors: Sitting, prolonged standing/wwalking Relieving factors: The pain will stop on it own rest  PRECAUTIONS: None  RED FLAGS: None   WEIGHT BEARING  RESTRICTIONS: No  FALLS:  Has patient fallen in last 6 months? No  LIVING ENVIRONMENT: Lives with: lives with their family Lives in: House/apartment Able to access home  OCCUPATION: [employer], PCA  PLOF: Independent  PATIENT GOALS: Less pain   OBJECTIVE:  Note: Objective measures were completed at Evaluation unless otherwise noted.  DIAGNOSTIC FINDINGS:  02/29/24 IMPRESSION: 1. No acute abnormality in lumbar spine. 2. Facet arthropathy at L4-L5.  PATIENT SURVEYS:  Modified Oswestry: 16/50=32%  Interpretation of scores: Score Category Description  0-20% Minimal Disability The patient can cope with most living activities. Usually no treatment is indicated apart from advice on lifting, sitting and exercise  21-40% Moderate Disability The patient experiences more pain and difficulty with sitting, lifting and standing. Travel and social life are more difficult and they may be disabled from work. Personal care, sexual activity and sleeping are not grossly affected, and the patient can usually be managed by conservative means  41-60% Severe Disability Pain remains the main problem in this group, but activities of daily living are affected. These patients require a detailed investigation  61-80% Crippled Back pain impinges on all aspects of the patient's life. Positive intervention is required  81-100% Bed-bound These patients are either bed-bound or exaggerating  their symptoms   Minimally Clinically Important Difference (MCID) = 12.8%  COGNITION: Overall cognitive status: Within functional limits for tasks assessed     SENSATION: WFL  MUSCLE LENGTH: Hamstrings: Right WNLs deg; Left WNLs deg Debby test: Right WNLs deg; Left WNLs deg  POSTURE: rounded shoulders, forward head, and genu valgum  PALPATION: Not TTP  LUMBAR ROM:   AROM eval  Flexion Full, min increase pulling  Extension Full, min pressure pain  Right lateral flexion Full, min pressure pain  Left lateral flexion Full, no pain  Right rotation Full  Left rotation Full   (Blank rows = not tested)  LOWER EXTREMITY ROM:    Grossly WNLs Active  Right eval Left eval  Hip flexion    Hip extension    Hip abduction    Hip adduction    Hip internal rotation    Hip external rotation    Knee flexion    Knee extension    Ankle dorsiflexion    Ankle plantarflexion    Ankle inversion    Ankle eversion     (Blank rows = not tested)  LOWER EXTREMITY MMT:    MMT Right eval Left eval  Hip flexion 3+ 4+  Hip extension    Hip abduction    Hip adduction    Hip internal rotation    Hip external rotation    Knee flexion 5 5  Knee extension 5 5  Ankle dorsiflexion 5 5  Ankle plantarflexion 5 5  Ankle inversion    Ankle eversion     (Blank rows = not tested)  LUMBAR SPECIAL TESTS:  Straight leg raise test: Negative and Slump test: Negative  FUNCTIONAL TESTS:  5 times sit to stand: TBA 11.1 sec 5 x STS 05/14/24  GAIT: Distance walked: 200' Assistive device utilized: None Level of assistance: Complete Independence Comments: WNLs  TREATMENT DATE: [facility] Adult PT Treatment:                                                DATE: 06/06/24 Therapeutic Exercise/Activity: Nustep 5 mins L5  UE/LE Supine sciatic nerve glide x10 90/90 AB bracing x5 15 Piriformis pull and push POE Press up x10 with hips stabilized by pt Hinged hip lifting 1x10 20# Partial fwd lunge  x10 RDL x10 Reverse lunge x10 Self Care: Rest breaks every hour when driving long distances to manage sciatic pain Proper body mechanics for house hold actvities: vacuuming, hinged lifting, clothes from dryer and washing machine, half kneeling for low cabinets  [facility] Adult PT Treatment:                                                DATE: 06/04/24 Therapeutic Exercise/Activity: Nustep 6 mins L5  UE/LE Supine sciatic nerve glide x10 SLR c quad set 2x10 c AB bracing each 90/90 AB bracing x5 10 Piriformis pull and push Bridge 2 x 10 POE Press up x10 with hips stabilized by pt 5xSTS Hinged hip sit to standing c dowel for feedback Hinged hip lifting 2x10 20# Self Care: Rest breaks every hour when driving long distances to manage sciatic pain  PATIENT EDUCATION:  Education details: Eval findings, POC, HEP, self care  Person educated: Patient Education method: Explanation, Demonstration, Tactile cues, Verbal cues, and Handouts Education comprehension: verbalized understanding, returned demonstration, verbal cues required, and tactile cues required  HOME EXERCISE PROGRAM: Access Code: [email or web address] URL: [email or web address] Date: 06/04/2024 Prepared by: Dasie Daft  Exercises - Lying Prone  - 4 x daily - 7 x weekly - 1 sets - 1 reps - 2-5 hold - Static Prone on Elbows  - 4 x daily - 7 x weekly - 1 sets - 10 reps - 2-5 hold - Prone Press Up  - 4 x daily - 7 x weekly - 1 sets - 10 reps - 1 hold - Sidelying Hip Abduction  - 1 x daily - 7 x weekly - 2 sets - 10 reps - Prone Alternating Arm and Leg Lifts  - 1 x daily - 7 x weekly - 2 sets - 10 reps - Supine Bridge  - 1 x daily - 7 x weekly - 2 sets - 10 reps - 5 hold - Supine Sciatic Nerve Glide  - 1 x daily - 7 x weekly - 1 sets - 10 reps - 1 hold - Supine Transversus Abdominis Bracing - Hands on Stomach  - 1 x daily - 7 x weekly - 1 sets - 15 reps - 3 hold - Supine Pelvic Tilt with Straight Leg Raise  - 1 x daily - 7 x  weekly - 1 sets - 15 reps - 3 hold - Supine 90/90 Abdominal Bracing  - 1 x daily - 7 x weekly - 3 sets - 5-10 reps - 3 hold - Supine Piriformis Stretch with Foot on Ground  - 1 x daily - 7 x weekly - 1 sets - 3 reps - 30 hold - Supine Figure 4 Piriformis Stretch  - 1 x daily - 7 x weekly - 1 sets - 3 reps - 30 hold  ASSESSMENT: CLINICAL IMPRESSION: Continued PT for lumbopelvic flexibility and strengthening c ext bias. Instructed pt in proper body mechanics and corresponding exs to mimic these activities. Pt tolerated PT today without adverse effects. Pt demonstrated proper technic with proper body mechanics. Pt will continue to benefit from skilled PT to address impairments for improved function c minimized pain.   EVAL: Patient is a  38 y.o. female who was seen today for physical therapy evaluation and treatment for low back. Pt presents to PT with radicular R LE pain, N/T, and weakness of the R hip flexion with myotome for L4. An assessment for directional preference for treatment was completed with pt responding positively to extension biased exs with reduction of R LE pain. Pt will benefit from skilled PT 2w6 to address impairments to optimize back/LE function with less pain.    OBJECTIVE IMPAIRMENTS: decreased activity tolerance, decreased strength, obesity, and pain.   ACTIVITY LIMITATIONS: lifting, sitting, standing, and locomotion level  PARTICIPATION LIMITATIONS: meal prep, cleaning, laundry, driving, shopping, community activity, and occupation  PERSONAL FACTORS: Fitness, Past/current experiences, Time since onset of injury/illness/exacerbation, and 1 comorbidity: high BMI are also affecting patient's functional outcome.   REHAB POTENTIAL: Good  CLINICAL DECISION MAKING: Evolving/moderate complexity  EVALUATION COMPLEXITY: Moderate   GOALS:  SHORT TERM GOALS: Target date: 05/31/24  Pt will be Ind in an initial HEP  Baseline:started 05/28/24; pt is completing correctly Goal  status: MET  2.  Pt will report 50% or greater decrease in pain with daily activities for improved function and QOL  Baseline:  05/28/24: est 50% improvement Goal status: MET  LONG TERM GOALS: Target date: 06/28/24  Pt will be Ind in a final HEP to maintain achieved LOF Baseline: started Goal status: ONGOING  2.  Pt will report 75% or greater decrease in pain with daily activities for improved function and QOL  Baseline:  06/04/24: Pt est 65% improved Goal status: INITIAL  3.  Pt will demonstrate appropriate understanding of proper body mechanics with daily activities to minimize pain and strain Baseline:  Goal status: IMPROVED  4.  Improve 5xSTS by MCID of 5 as indication of improved functional mobility Baseline: 05/14/24  11.1 06/04/24: 9.6 Goal status: MET: at appropriate level  5.  Pt's Mod Oswestry score will improve by the MCID to 19% as indication of improved function  Baseline: 32% Goal status: INITIAL  PLAN:  PT FREQUENCY: 2x/week  PT DURATION: 6 weeks  PLANNED INTERVENTIONS: 97164- PT Re-evaluation, 97110-Therapeutic exercises, 97530- Therapeutic activity, 97112- Neuromuscular re-education, 97535- Self Care, 02859- Manual therapy, [id number]- Aquatic Therapy, H9716- Electrical stimulation (unattended), 20560 (1-2 muscles), 20561 (3+ muscles)- Dry Needling, Patient/Family education, Taping, Spinal mobilization, Cryotherapy, and Moist heat.  PLAN FOR NEXT SESSION: Assess 5xSTS; assess response to HEP; progress therex as indicated; use of modalities, manual therapy; and TPDN as indicated.   Faust Thorington MS, PT 06/06/24 11:10 AM

## 2024-06-06 ENCOUNTER — Ambulatory Visit

## 2024-06-06 DIAGNOSIS — M5441 Lumbago with sciatica, right side: Secondary | ICD-10-CM | POA: Diagnosis not present

## 2024-06-06 DIAGNOSIS — G8929 Other chronic pain: Secondary | ICD-10-CM

## 2024-06-06 DIAGNOSIS — M6281 Muscle weakness (generalized): Secondary | ICD-10-CM

## 2024-06-06 DIAGNOSIS — R262 Difficulty in walking, not elsewhere classified: Secondary | ICD-10-CM

## 2024-06-13 NOTE — Therapy (Incomplete)
 OUTPATIENT PHYSICAL THERAPY THORACOLUMBAR TREATMENT   Patient Name: Vanessa Bennett MRN: [id number] DOB:[birth year], 38 y.o., female Today's Date: 06/13/2024  END OF SESSION:          Past Medical History:  Diagnosis Date   ADHD    Carpal tunnel syndrome    Eczema    Sciatica    Past Surgical History:  Procedure Laterality Date   CHOLECYSTECTOMY  2010   Patient Active Problem List   Diagnosis Date Noted   History of prediabetes 09/12/2022   Healthcare maintenance 09/12/2022   Thyroid  function test abnormal 09/12/2022   Acute right-sided low back pain with right-sided sciatica 08/31/2022    PCP: Cesario Boer, MD   REFERRING PROVIDER: Cesario Boer, MD   REFERRING DIAG: Low back  Rationale for Evaluation and Treatment: Rehabilitation  THERAPY DIAG:  No diagnosis found.  ONSET DATE: July  SUBJECTIVE:                                                                                                                                                                                           SUBJECTIVE STATEMENT: Pt reports overall improvement c min gains recently. Notes N/T is still present in th R leg   EVAL: Pt reports developing R low back, buttock, and post/lat R LE pain around July without an obvious incident. Now the pain is better, but she can develop significant pain esp with sitting/driving. Pt endorses N/T of her R lateral calf and the bottom of the medial 3 toes.   PERTINENT HISTORY:  High BMI  PAIN:  Are you having pain? Yes: NPRS scale: 1-6/10. Currently:0/10 intermittently Pain location: R low back, buttock, and post/lat R LE Pain description: intermittent, sharp, throbbing Aggravating factors: Sitting, prolonged standing/wwalking Relieving factors: The pain will stop on it own rest  PRECAUTIONS: None  RED FLAGS: None   WEIGHT BEARING RESTRICTIONS: No  FALLS:  Has patient fallen in last 6 months? No  LIVING ENVIRONMENT: Lives with: lives with  their family Lives in: House/apartment Able to access home  OCCUPATION: [employer], PCA  PLOF: Independent  PATIENT GOALS: Less pain   OBJECTIVE:  Note: Objective measures were completed at Evaluation unless otherwise noted.  DIAGNOSTIC FINDINGS:  02/29/24 IMPRESSION: 1. No acute abnormality in lumbar spine. 2. Facet arthropathy at L4-L5.  PATIENT SURVEYS:  Modified Oswestry: 16/50=32%  Interpretation of scores: Score Category Description  0-20% Minimal Disability The patient can cope with most living activities. Usually no treatment is indicated apart from advice on lifting, sitting and exercise  21-40% Moderate Disability The patient experiences more pain and difficulty with sitting, lifting and standing.  Travel and social life are more difficult and they may be disabled from work. Personal care, sexual activity and sleeping are not grossly affected, and the patient can usually be managed by conservative means  41-60% Severe Disability Pain remains the main problem in this group, but activities of daily living are affected. These patients require a detailed investigation  61-80% Crippled Back pain impinges on all aspects of the patient's life. Positive intervention is required  81-100% Bed-bound These patients are either bed-bound or exaggerating their symptoms   Minimally Clinically Important Difference (MCID) = 12.8%  COGNITION: Overall cognitive status: Within functional limits for tasks assessed     SENSATION: WFL  MUSCLE LENGTH: Hamstrings: Right WNLs deg; Left WNLs deg Debby test: Right WNLs deg; Left WNLs deg  POSTURE: rounded shoulders, forward head, and genu valgum  PALPATION: Not TTP  LUMBAR ROM:   AROM eval  Flexion Full, min increase pulling  Extension Full, min pressure pain  Right lateral flexion Full, min pressure pain  Left lateral flexion Full, no pain  Right rotation Full  Left rotation Full   (Blank rows = not tested)  LOWER  EXTREMITY ROM:    Grossly WNLs Active  Right eval Left eval  Hip flexion    Hip extension    Hip abduction    Hip adduction    Hip internal rotation    Hip external rotation    Knee flexion    Knee extension    Ankle dorsiflexion    Ankle plantarflexion    Ankle inversion    Ankle eversion     (Blank rows = not tested)  LOWER EXTREMITY MMT:    MMT Right eval Left eval  Hip flexion 3+ 4+  Hip extension    Hip abduction    Hip adduction    Hip internal rotation    Hip external rotation    Knee flexion 5 5  Knee extension 5 5  Ankle dorsiflexion 5 5  Ankle plantarflexion 5 5  Ankle inversion    Ankle eversion     (Blank rows = not tested)  LUMBAR SPECIAL TESTS:  Straight leg raise test: Negative and Slump test: Negative  FUNCTIONAL TESTS:  5 times sit to stand: TBA 11.1 sec 5 x STS 05/14/24  GAIT: Distance walked: 200' Assistive device utilized: None Level of assistance: Complete Independence Comments: WNLs  TREATMENT DATE: [facility] Adult PT Treatment:                                                DATE: 06/14/24 Therapeutic Exercise/Activity: Nustep 5 mins L5  UE/LE Supine sciatic nerve glide x10 90/90 AB bracing x5 15 Piriformis pull and push POE Press up x10 with hips stabilized by pt Hinged hip lifting 1x10 20# Partial fwd lunge x10 RDL x10 Reverse lunge x10 Self Care: Rest breaks every hour when driving long distances to manage sciatic pain Proper body mechanics for house hold actvities: vacuuming, hinged lifting, clothes from dryer and washing machine, half kneeling for low cabinets Therapeutic Exercise: *** Manual Therapy: *** Neuromuscular re-ed: *** Therapeutic Activity: *** Modalities: *** Self Care: ***   [facility] Adult PT Treatment:  DATE: 06/06/24 Therapeutic Exercise/Activity: Nustep 5 mins L5  UE/LE Supine sciatic nerve glide x10 90/90 AB bracing x5 15 Piriformis pull and  push POE Press up x10 with hips stabilized by pt Hinged hip lifting 1x10 20# Partial fwd lunge x10 RDL x10 Reverse lunge x10 Self Care: Rest breaks every hour when driving long distances to manage sciatic pain Proper body mechanics for house hold actvities: vacuuming, hinged lifting, clothes from dryer and washing machine, half kneeling for low cabinets  [facility] Adult PT Treatment:                                                DATE: 06/04/24 Therapeutic Exercise/Activity: Nustep 6 mins L5  UE/LE Supine sciatic nerve glide x10 SLR c quad set 2x10 c AB bracing each 90/90 AB bracing x5 10 Piriformis pull and push Bridge 2 x 10 POE Press up x10 with hips stabilized by pt 5xSTS Hinged hip sit to standing c dowel for feedback Hinged hip lifting 2x10 20# Self Care: Rest breaks every hour when driving long distances to manage sciatic pain  PATIENT EDUCATION:  Education details: Eval findings, POC, HEP, self care  Person educated: Patient Education method: Explanation, Demonstration, Tactile cues, Verbal cues, and Handouts Education comprehension: verbalized understanding, returned demonstration, verbal cues required, and tactile cues required  HOME EXERCISE PROGRAM: Access Code: [email or web address] URL: [email or web address] Date: 06/04/2024 Prepared by: Dasie Daft  Exercises - Lying Prone  - 4 x daily - 7 x weekly - 1 sets - 1 reps - 2-5 hold - Static Prone on Elbows  - 4 x daily - 7 x weekly - 1 sets - 10 reps - 2-5 hold - Prone Press Up  - 4 x daily - 7 x weekly - 1 sets - 10 reps - 1 hold - Sidelying Hip Abduction  - 1 x daily - 7 x weekly - 2 sets - 10 reps - Prone Alternating Arm and Leg Lifts  - 1 x daily - 7 x weekly - 2 sets - 10 reps - Supine Bridge  - 1 x daily - 7 x weekly - 2 sets - 10 reps - 5 hold - Supine Sciatic Nerve Glide  - 1 x daily - 7 x weekly - 1 sets - 10 reps - 1 hold - Supine Transversus Abdominis Bracing - Hands on Stomach  - 1 x daily - 7 x  weekly - 1 sets - 15 reps - 3 hold - Supine Pelvic Tilt with Straight Leg Raise  - 1 x daily - 7 x weekly - 1 sets - 15 reps - 3 hold - Supine 90/90 Abdominal Bracing  - 1 x daily - 7 x weekly - 3 sets - 5-10 reps - 3 hold - Supine Piriformis Stretch with Foot on Ground  - 1 x daily - 7 x weekly - 1 sets - 3 reps - 30 hold - Supine Figure 4 Piriformis Stretch  - 1 x daily - 7 x weekly - 1 sets - 3 reps - 30 hold  ASSESSMENT: CLINICAL IMPRESSION: Continued PT for lumbopelvic flexibility and strengthening c ext bias. Instructed pt in proper body mechanics and corresponding exs to mimic these activities. Pt tolerated PT today without adverse effects. Pt demonstrated proper technic with proper body mechanics. Pt will continue to benefit from skilled PT to address impairments for improved function  c minimized pain.   EVAL: Patient is a 38 y.o. female who was seen today for physical therapy evaluation and treatment for low back. Pt presents to PT with radicular R LE pain, N/T, and weakness of the R hip flexion with myotome for L4. An assessment for directional preference for treatment was completed with pt responding positively to extension biased exs with reduction of R LE pain. Pt will benefit from skilled PT 2w6 to address impairments to optimize back/LE function with less pain.    OBJECTIVE IMPAIRMENTS: decreased activity tolerance, decreased strength, obesity, and pain.   ACTIVITY LIMITATIONS: lifting, sitting, standing, and locomotion level  PARTICIPATION LIMITATIONS: meal prep, cleaning, laundry, driving, shopping, community activity, and occupation  PERSONAL FACTORS: Fitness, Past/current experiences, Time since onset of injury/illness/exacerbation, and 1 comorbidity: high BMI are also affecting patient's functional outcome.   REHAB POTENTIAL: Good  CLINICAL DECISION MAKING: Evolving/moderate complexity  EVALUATION COMPLEXITY: Moderate   GOALS:  SHORT TERM GOALS: Target date:  05/31/24  Pt will be Ind in an initial HEP  Baseline:started 05/28/24; pt is completing correctly Goal status: MET  2.  Pt will report 50% or greater decrease in pain with daily activities for improved function and QOL  Baseline:  05/28/24: est 50% improvement Goal status: MET  LONG TERM GOALS: Target date: 06/28/24  Pt will be Ind in a final HEP to maintain achieved LOF Baseline: started Goal status: ONGOING  2.  Pt will report 75% or greater decrease in pain with daily activities for improved function and QOL  Baseline:  06/04/24: Pt est 65% improved Goal status: INITIAL  3.  Pt will demonstrate appropriate understanding of proper body mechanics with daily activities to minimize pain and strain Baseline:  Goal status: IMPROVED  4.  Improve 5xSTS by MCID of 5 as indication of improved functional mobility Baseline: 05/14/24  11.1 06/04/24: 9.6 Goal status: MET: at appropriate level  5.  Pt's Mod Oswestry score will improve by the MCID to 19% as indication of improved function  Baseline: 32% Goal status: INITIAL  PLAN:  PT FREQUENCY: 2x/week  PT DURATION: 6 weeks  PLANNED INTERVENTIONS: 97164- PT Re-evaluation, 97110-Therapeutic exercises, 97530- Therapeutic activity, 97112- Neuromuscular re-education, 97535- Self Care, 02859- Manual therapy, [id number]- Aquatic Therapy, H9716- Electrical stimulation (unattended), 20560 (1-2 muscles), 20561 (3+ muscles)- Dry Needling, Patient/Family education, Taping, Spinal mobilization, Cryotherapy, and Moist heat.  PLAN FOR NEXT SESSION: Assess 5xSTS; assess response to HEP; progress therex as indicated; use of modalities, manual therapy; and TPDN as indicated.   Christopherjohn Schiele MS, PT 06/13/24 7:43 PM

## 2024-06-14 ENCOUNTER — Ambulatory Visit

## 2024-06-18 ENCOUNTER — Ambulatory Visit: Admitting: Physical Therapy

## 2024-06-18 ENCOUNTER — Ambulatory Visit

## 2024-06-18 DIAGNOSIS — M6281 Muscle weakness (generalized): Secondary | ICD-10-CM

## 2024-06-18 DIAGNOSIS — R262 Difficulty in walking, not elsewhere classified: Secondary | ICD-10-CM

## 2024-06-18 DIAGNOSIS — M5441 Lumbago with sciatica, right side: Secondary | ICD-10-CM | POA: Diagnosis not present

## 2024-06-18 DIAGNOSIS — G8929 Other chronic pain: Secondary | ICD-10-CM

## 2024-06-18 NOTE — Therapy (Signed)
 OUTPATIENT PHYSICAL THERAPY THORACOLUMBAR TREATMENT   Patient Name: Vanessa Bennett MRN: [id number] DOB:15-Apr-1986, 38 y.o., female Today's Date: 06/18/2024  END OF SESSION:  PT End of Session - 06/18/24 1336     Visit Number 9    Number of Visits 12    Date for Recertification  06/28/24    Authorization Type [employer] [place] MEDICAID [facility]    Authorization Time Period Approved 10 PT visits from 05/07/24-07/06/24    Authorization - Visit Number 9    Authorization - Number of Visits 10    PT Start Time 1330    PT Stop Time 1415    PT Time Calculation (min) 45 min    Activity Tolerance Patient tolerated treatment well    Behavior During Therapy [facility] for tasks assessed/performed                 Past Medical History:  Diagnosis Date   ADHD    Carpal tunnel syndrome    Eczema    Sciatica    Past Surgical History:  Procedure Laterality Date   CHOLECYSTECTOMY  2010   Patient Active Problem List   Diagnosis Date Noted   History of prediabetes 09/12/2022   Healthcare maintenance 09/12/2022   Thyroid  function test abnormal 09/12/2022   Acute right-sided low back pain with right-sided sciatica 08/31/2022    PCP: Cesario Boer, MD   REFERRING PROVIDER: Cesario Boer, MD   REFERRING DIAG: Low back  Rationale for Evaluation and Treatment: Rehabilitation  THERAPY DIAG:  Chronic right-sided low back pain with right-sided sciatica  Difficulty in walking, not elsewhere classified  Muscle weakness (generalized)  ONSET DATE: July  SUBJECTIVE:                                                                                                                                                                                           SUBJECTIVE STATEMENT: Pt reports her low back is alright, primarily bothers her with strenuous activities- Lifting heavy objects, throwing trash bags into dumpsters  EVAL: Pt reports developing R low back, buttock, and post/lat R LE pain around July without an  obvious incident. Now the pain is better, but she can develop significant pain esp with sitting/driving. Pt endorses N/T of her R lateral calf and the bottom of the medial 3 toes.   PERTINENT HISTORY:  High BMI  PAIN:  Are you having pain? Yes: NPRS scale: 1-6/10. Currently: 1/10 intermittently Pain location: R low back, buttock, and post/lat R LE Pain description: intermittent, sharp, throbbing Aggravating factors: Sitting, prolonged standing/wwalking Relieving factors: The pain will stop on it own rest  PRECAUTIONS: None  RED FLAGS:  None   WEIGHT BEARING RESTRICTIONS: No  FALLS:  Has patient fallen in last 6 months? No  LIVING ENVIRONMENT: Lives with: lives with their family Lives in: House/apartment Able to access home  OCCUPATION: [employer], PCA  PLOF: Independent  PATIENT GOALS: Less pain   OBJECTIVE:  Note: Objective measures were completed at Evaluation unless otherwise noted.  DIAGNOSTIC FINDINGS:  02/29/24 IMPRESSION: 1. No acute abnormality in lumbar spine. 2. Facet arthropathy at L4-L5.  PATIENT SURVEYS:  Modified Oswestry: 16/50=32%  Interpretation of scores: Score Category Description  0-20% Minimal Disability The patient can cope with most living activities. Usually no treatment is indicated apart from advice on lifting, sitting and exercise  21-40% Moderate Disability The patient experiences more pain and difficulty with sitting, lifting and standing. Travel and social life are more difficult and they may be disabled from work. Personal care, sexual activity and sleeping are not grossly affected, and the patient can usually be managed by conservative means  41-60% Severe Disability Pain remains the main problem in this group, but activities of daily living are affected. These patients require a detailed investigation  61-80% Crippled Back pain impinges on all aspects of the patient's life. Positive intervention is required  81-100%  Bed-bound These patients are either bed-bound or exaggerating their symptoms   Minimally Clinically Important Difference (MCID) = 12.8%  COGNITION: Overall cognitive status: Within functional limits for tasks assessed     SENSATION: WFL  MUSCLE LENGTH: Hamstrings: Right WNLs deg; Left WNLs deg Debby test: Right WNLs deg; Left WNLs deg  POSTURE: rounded shoulders, forward head, and genu valgum  PALPATION: Not TTP  LUMBAR ROM:   AROM eval  Flexion Full, min increase pulling  Extension Full, min pressure pain  Right lateral flexion Full, min pressure pain  Left lateral flexion Full, no pain  Right rotation Full  Left rotation Full   (Blank rows = not tested)  LOWER EXTREMITY ROM:    Grossly WNLs Active  Right eval Left eval  Hip flexion    Hip extension    Hip abduction    Hip adduction    Hip internal rotation    Hip external rotation    Knee flexion    Knee extension    Ankle dorsiflexion    Ankle plantarflexion    Ankle inversion    Ankle eversion     (Blank rows = not tested)  LOWER EXTREMITY MMT:    MMT Right eval Left eval  Hip flexion 3+ 4+  Hip extension    Hip abduction    Hip adduction    Hip internal rotation    Hip external rotation    Knee flexion 5 5  Knee extension 5 5  Ankle dorsiflexion 5 5  Ankle plantarflexion 5 5  Ankle inversion    Ankle eversion     (Blank rows = not tested)  LUMBAR SPECIAL TESTS:  Straight leg raise test: Negative and Slump test: Negative  FUNCTIONAL TESTS:  5 times sit to stand: TBA 11.1 sec 5 x STS 05/14/24  GAIT: Distance walked: 200' Assistive device utilized: None Level of assistance: Complete Independence Comments: WNLs  TREATMENT DATE: [facility] Adult PT Treatment:                                                DATE: 06/14/24 Therapeutic Exercise/Activity:  Nustep 5 mins L5  UE/LE Supine sciatic nerve glide x10 each Leg dead bugs 8x5 Piriformis pull and push 30 POE Press up x10 with hips  stabilized by pt Plank from knees x10 15 Hinged hip lifting 1x10 30# Dead hip lifts 1x10 30# Low to high diagonal lidts x10 7# Self Care: Proper body mechanics for hinged hip lift and low to high diagonal lifts  [facility] Adult PT Treatment:                                                DATE: 06/06/24 Therapeutic Exercise/Activity: Nustep 5 mins L5  UE/LE Supine sciatic nerve glide x10 90/90 AB bracing x5 15 Piriformis pull and push POE Press up x10 with hips stabilized by pt Hinged hip lifting 1x10 20# Partial fwd lunge x10 RDL x10 Reverse lunge x10 Self Care: Rest breaks every hour when driving long distances to manage sciatic pain Proper body mechanics for house hold actvities: vacuuming, hinged lifting, clothes from dryer and washing machine, half kneeling for low cabinets   PATIENT EDUCATION:  Education details: Eval findings, POC, HEP, self care  Person educated: Patient Education method: Explanation, Demonstration, Tactile cues, Verbal cues, and Handouts Education comprehension: verbalized understanding, returned demonstration, verbal cues required, and tactile cues required  HOME EXERCISE PROGRAM: Access Code: [email or web address] URL: [email or web address] Date: 06/04/2024 Prepared by: Dasie Daft  Exercises - Lying Prone  - 4 x daily - 7 x weekly - 1 sets - 1 reps - 2-5 hold - Static Prone on Elbows  - 4 x daily - 7 x weekly - 1 sets - 10 reps - 2-5 hold - Prone Press Up  - 4 x daily - 7 x weekly - 1 sets - 10 reps - 1 hold - Sidelying Hip Abduction  - 1 x daily - 7 x weekly - 2 sets - 10 reps - Prone Alternating Arm and Leg Lifts  - 1 x daily - 7 x weekly - 2 sets - 10 reps - Supine Bridge  - 1 x daily - 7 x weekly - 2 sets - 10 reps - 5 hold - Supine Sciatic Nerve Glide  - 1 x daily - 7 x weekly - 1 sets - 10 reps - 1 hold - Supine Transversus Abdominis Bracing - Hands on Stomach  - 1 x daily - 7 x weekly - 1 sets - 15 reps - 3 hold - Supine Pelvic Tilt with  Straight Leg Raise  - 1 x daily - 7 x weekly - 1 sets - 15 reps - 3 hold - Supine 90/90 Abdominal Bracing  - 1 x daily - 7 x weekly - 3 sets - 5-10 reps - 3 hold - Supine Piriformis Stretch with Foot on Ground  - 1 x daily - 7 x weekly - 1 sets - 3 reps - 30 hold - Supine Figure 4 Piriformis Stretch  - 1 x daily - 7 x weekly - 1 sets - 3 reps - 30 hold  ASSESSMENT: CLINICAL IMPRESSION: Pt continues to report good improvement re: pain and function. She notes heavier lifting can cause low level R gluteal discomfort. Completed PT for lumbopelvic flexibility and strengthening c ext bias, and for work related lifting activities c proper technique. Pt tolerated the lifting activities today c without adverse effects. Pt has one more scheduled appt and will complete  a re-assessment to determine course of care.SABRA  EVAL: Patient is a 38 y.o. female who was seen today for physical therapy evaluation and treatment for low back. Pt presents to PT with radicular R LE pain, N/T, and weakness of the R hip flexion with myotome for L4. An assessment for directional preference for treatment was completed with pt responding positively to extension biased exs with reduction of R LE pain. Pt will benefit from skilled PT 2w6 to address impairments to optimize back/LE function with less pain.    OBJECTIVE IMPAIRMENTS: decreased activity tolerance, decreased strength, obesity, and pain.   ACTIVITY LIMITATIONS: lifting, sitting, standing, and locomotion level  PARTICIPATION LIMITATIONS: meal prep, cleaning, laundry, driving, shopping, community activity, and occupation  PERSONAL FACTORS: Fitness, Past/current experiences, Time since onset of injury/illness/exacerbation, and 1 comorbidity: high BMI are also affecting patient's functional outcome.   REHAB POTENTIAL: Good  CLINICAL DECISION MAKING: Evolving/moderate complexity  EVALUATION COMPLEXITY: Moderate   GOALS:  SHORT TERM GOALS: Target date: 05/31/24  Pt will  be Ind in an initial HEP  Baseline:started 05/28/24; pt is completing correctly Goal status: MET  2.  Pt will report 50% or greater decrease in pain with daily activities for improved function and QOL  Baseline:  05/28/24: est 50% improvement Goal status: MET  LONG TERM GOALS: Target date: 06/28/24  Pt will be Ind in a final HEP to maintain achieved LOF Baseline: started Goal status: ONGOING  2.  Pt will report 75% or greater decrease in pain with daily activities for improved function and QOL  Baseline:  06/04/24: Pt est 65% improved Goal status: INITIAL  3.  Pt will demonstrate appropriate understanding of proper body mechanics with daily activities to minimize pain and strain Baseline:  Goal status: IMPROVED  4.  Improve 5xSTS by MCID of 5 as indication of improved functional mobility Baseline: 05/14/24  11.1 06/04/24: 9.6 Goal status: MET: at appropriate level  5.  Pt's Mod Oswestry score will improve by the MCID to 19% as indication of improved function  Baseline: 32% Goal status: INITIAL  PLAN:  PT FREQUENCY: 2x/week  PT DURATION: 6 weeks  PLANNED INTERVENTIONS: 97164- PT Re-evaluation, 97110-Therapeutic exercises, 97530- Therapeutic activity, 97112- Neuromuscular re-education, 97535- Self Care, 02859- Manual therapy, [id number]- Aquatic Therapy, H9716- Electrical stimulation (unattended), 20560 (1-2 muscles), 20561 (3+ muscles)- Dry Needling, Patient/Family education, Taping, Spinal mobilization, Cryotherapy, and Moist heat.  PLAN FOR NEXT SESSION: Assess 5xSTS; assess response to HEP; progress therex as indicated; use of modalities, manual therapy; and TPDN as indicated.   Kaliel Bolds MS, PT 06/18/24 2:36 PM

## 2024-06-25 NOTE — Therapy (Signed)
 OUTPATIENT PHYSICAL THERAPY THORACOLUMBAR TREATMENT/Discharge   Patient Name: Vanessa Bennett MRN: [id number] DOB:1986/05/13, 38 y.o., female Today's Date: 06/26/2024  END OF SESSION:  PT End of Session - 06/26/24 1017     Visit Number 10    Number of Visits 12    Date for Recertification  06/28/24    Authorization Type [place] MEDICAID [facility]    Authorization Time Period Approved 10 PT visits from 05/07/24-07/06/24    Authorization - Visit Number 10    Authorization - Number of Visits 10    PT Start Time 1016    PT Stop Time 1100    PT Time Calculation (min) 44 min    Activity Tolerance Patient tolerated treatment well    Behavior During Therapy [facility] for tasks assessed/performed                  Past Medical History:  Diagnosis Date   ADHD    Carpal tunnel syndrome    Eczema    Sciatica    Past Surgical History:  Procedure Laterality Date   CHOLECYSTECTOMY  2010   Patient Active Problem List   Diagnosis Date Noted   History of prediabetes 09/12/2022   Healthcare maintenance 09/12/2022   Thyroid  function test abnormal 09/12/2022   Acute right-sided low back pain with right-sided sciatica 08/31/2022    PCP: Cesario Boer, MD   REFERRING PROVIDER: Cesario Boer, MD   REFERRING DIAG: Low back  Rationale for Evaluation and Treatment: Rehabilitation  THERAPY DIAG:  Chronic right-sided low back pain with right-sided sciatica  Difficulty in walking, not elsewhere classified  Muscle weakness (generalized)  ONSET DATE: July  SUBJECTIVE:                                                                                                                                                                                           SUBJECTIVE STATEMENT: Pt reports her low back is much better. It is not excruciating like it was, but she still experiences pain in the L gluteal/hamstring area especially it she uses her legs or sits a lot.  EVAL: Pt reports developing R low back,  buttock, and post/lat R LE pain around July without an obvious incident. Now the pain is better, but she can develop significant pain esp with sitting/driving. Pt endorses N/T of her R lateral calf and the bottom of the medial 3 toes.   PERTINENT HISTORY:  High BMI  PAIN:  Are you having pain? Yes: NPRS scale: 1-6/10. Currently: 3/10 intermittently Pain location: R low back, buttock, and post/lat R LE Pain description: intermittent, sharp, throbbing Aggravating factors: Sitting, prolonged standing/wwalking Relieving  factors: The pain will stop on it own rest  PRECAUTIONS: None  RED FLAGS: None   WEIGHT BEARING RESTRICTIONS: No  FALLS:  Has patient fallen in last 6 months? No  LIVING ENVIRONMENT: Lives with: lives with their family Lives in: House/apartment Able to access home  OCCUPATION: [employer], PCA  PLOF: Independent  PATIENT GOALS: Less pain   OBJECTIVE:  Note: Objective measures were completed at Evaluation unless otherwise noted.  DIAGNOSTIC FINDINGS:  02/29/24 IMPRESSION: 1. No acute abnormality in lumbar spine. 2. Facet arthropathy at L4-L5.  PATIENT SURVEYS:  Modified Oswestry: 16/50=32%  Interpretation of scores: Score Category Description  0-20% Minimal Disability The patient can cope with most living activities. Usually no treatment is indicated apart from advice on lifting, sitting and exercise  21-40% Moderate Disability The patient experiences more pain and difficulty with sitting, lifting and standing. Travel and social life are more difficult and they may be disabled from work. Personal care, sexual activity and sleeping are not grossly affected, and the patient can usually be managed by conservative means  41-60% Severe Disability Pain remains the main problem in this group, but activities of daily living are affected. These patients require a detailed investigation  61-80% Crippled Back pain impinges on all aspects of the patient's  life. Positive intervention is required  81-100% Bed-bound These patients are either bed-bound or exaggerating their symptoms   Minimally Clinically Important Difference (MCID) = 12.8%  COGNITION: Overall cognitive status: Within functional limits for tasks assessed     SENSATION: WFL  MUSCLE LENGTH: Hamstrings: Right WNLs deg; Left WNLs deg Debby test: Right WNLs deg; Left WNLs deg  POSTURE: rounded shoulders, forward head, and genu valgum  PALPATION: Not TTP  LUMBAR ROM:   AROM eval  Flexion Full, min increase pulling  Extension Full, min pressure pain  Right lateral flexion Full, min pressure pain  Left lateral flexion Full, no pain  Right rotation Full  Left rotation Full   (Blank rows = not tested)  LOWER EXTREMITY ROM:    Grossly WNLs Active  Right eval Left eval  Hip flexion    Hip extension    Hip abduction    Hip adduction    Hip internal rotation    Hip external rotation    Knee flexion    Knee extension    Ankle dorsiflexion    Ankle plantarflexion    Ankle inversion    Ankle eversion     (Blank rows = not tested)  LOWER EXTREMITY MMT:    MMT Right eval Left eval  Hip flexion 3+ 4+  Hip extension    Hip abduction    Hip adduction    Hip internal rotation    Hip external rotation    Knee flexion 5 5  Knee extension 5 5  Ankle dorsiflexion 5 5  Ankle plantarflexion 5 5  Ankle inversion    Ankle eversion     (Blank rows = not tested)  LUMBAR SPECIAL TESTS:  Straight leg raise test: Negative and Slump test: Negative  FUNCTIONAL TESTS:  5 times sit to stand: TBA 11.1 sec 5 x STS 05/14/24  GAIT: Distance walked: 200' Assistive device utilized: None Level of assistance: Complete Independence Comments: WNLs  TREATMENT DATE: [facility] Adult PT Treatment:  DATE: 06/26/24 Therapeutic Exercise/Activity: Nustep 5 mins L5  UE/LE Mod Oswestry Seated sciatic nerve glide x10 each POE Press up  x10 with hips stabilized by pt Plank from knees x5 15 Prone Alternating Arm and Leg Lifts 10 reps Supine Transversus Abdominis Bracing 5 reps Supine Pelvic Tilt with Straight Leg Raise 5 reps Supine 90/90 Abdominal Bracing 5 reps 10 Leg dead bugs 8x2 Sit t/f standing x10 c hinged hip Self Care: Proper body mechanics for household activities  [facility] Adult PT Treatment:                                                DATE: 06/14/24 Therapeutic Exercise/Activity: Nustep 5 mins L5  UE/LE Supine sciatic nerve glide x10 each Leg dead bugs 8x5 Piriformis pull and push 30 POE Press up x10 with hips stabilized by pt Plank from knees x10 15 Hinged hip lifting 1x10 30# Dead hip lifts 1x10 30# Low to high diagonal lidts x10 7# Self Care: Proper body mechanics for hinged hip lift and low to high diagonal lifts  [facility] Adult PT Treatment:                                                DATE: 06/06/24 Therapeutic Exercise/Activity: Nustep 5 mins L5  UE/LE Supine sciatic nerve glide x10 90/90 AB bracing x5 15 Piriformis pull and push POE Press up x10 with hips stabilized by pt Hinged hip lifting 1x10 20# Partial fwd lunge x10 RDL x10 Reverse lunge x10 Self Care: Rest breaks every hour when driving long distances to manage sciatic pain Proper body mechanics for house hold actvities: vacuuming, hinged lifting, clothes from dryer and washing machine, half kneeling for low cabinets   PATIENT EDUCATION:  Education details: Eval findings, POC, HEP, self care  Person educated: Patient Education method: Explanation, Demonstration, Tactile cues, Verbal cues, and Handouts Education comprehension: verbalized understanding, returned demonstration, verbal cues required, and tactile cues required  HOME EXERCISE PROGRAM: Access Code: [email or web address] URL: [email or web address] Date: 06/26/2024 Prepared by: Dasie Daft  Exercises - Lying Prone  - 4 x daily - 7 x weekly - 1 sets - 1 reps  - 2-5 hold - Static Prone on Elbows  - 4 x daily - 7 x weekly - 1 sets - 10 reps - 2-5 hold - Prone Press Up  - 4 x daily - 7 x weekly - 1 sets - 10 reps - 1 hold - Sidelying Hip Abduction  - 1 x daily - 7 x weekly - 2 sets - 10 reps - Prone Alternating Arm and Leg Lifts  - 1 x daily - 7 x weekly - 2 sets - 10 reps - Supine Bridge  - 1 x daily - 7 x weekly - 2 sets - 10 reps - 5 hold - Supine Sciatic Nerve Glide  - 1 x daily - 7 x weekly - 1 sets - 10 reps - 1 hold - Supine Transversus Abdominis Bracing - Hands on Stomach  - 1 x daily - 7 x weekly - 1 sets - 15 reps - 3 hold - Supine Pelvic Tilt with Straight Leg Raise  - 1 x daily - 7 x weekly - 1 sets - 15 reps - 3  hold - Supine 90/90 Abdominal Bracing  - 1 x daily - 7 x weekly - 3 sets - 5-10 reps - 3 hold - Supine Piriformis Stretch with Foot on Ground  - 1 x daily - 7 x weekly - 1 sets - 3 reps - 30 hold - Supine Figure 4 Piriformis Stretch  - 1 x daily - 7 x weekly - 1 sets - 3 reps - 30 hold - Plank on Knees  - 1 x daily - 7 x weekly - 1 sets - 10 reps - 10 hold - Dead Bug  - 1 x daily - 7 x weekly - 1 sets - 10 reps  ASSESSMENT: CLINICAL IMPRESSION: Pt completed her course of PT today. Overall, pt has made good progress in all areas re: pain, function, HEP, and understanding of proper body mechanics. Pt's perceives her tolerance to functional activities as much improved, progressing from 32% to 18% disability. Re: pain pt reports 65% improvement with her no longer experiencing significant pain, but she does continue to have intermittent pain in the R gluteal/hamstring area. Pt expresses desire to have a MRI to assess her issue further. Recommended pt schedule an appt c Dr. Clemon to discuss further care. Pt is Ind with a HEP to maintain or progress her achieved LOF.  EVAL: Patient is a 38 y.o. female who was seen today for physical therapy evaluation and treatment for low back. Pt presents to PT with radicular R LE pain, N/T, and weakness of  the R hip flexion with myotome for L4. An assessment for directional preference for treatment was completed with pt responding positively to extension biased exs with reduction of R LE pain. Pt will benefit from skilled PT 2w6 to address impairments to optimize back/LE function with less pain.    OBJECTIVE IMPAIRMENTS: decreased activity tolerance, decreased strength, obesity, and pain.   ACTIVITY LIMITATIONS: lifting, sitting, standing, and locomotion level  PARTICIPATION LIMITATIONS: meal prep, cleaning, laundry, driving, shopping, community activity, and occupation  PERSONAL FACTORS: Fitness, Past/current experiences, Time since onset of injury/illness/exacerbation, and 1 comorbidity: high BMI are also affecting patient's functional outcome.   REHAB POTENTIAL: Good  CLINICAL DECISION MAKING: Evolving/moderate complexity  EVALUATION COMPLEXITY: Moderate   GOALS:  SHORT TERM GOALS: Target date: 05/31/24  Pt will be Ind in an initial HEP  Baseline:started 05/28/24; pt is completing correctly Goal status: MET  2.  Pt will report 50% or greater decrease in pain with daily activities for improved function and QOL  Baseline:  05/28/24: est 50% improvement Goal status: MET  LONG TERM GOALS: Target date: 06/28/24  Pt will be Ind in a final HEP to maintain achieved LOF Baseline: started Goal status: MET  2.  Pt will report 75% or greater decrease in pain with daily activities for improved function and QOL  Baseline:  06/04/24: Pt est 65% improved 06/26/24:Pt est 65% improved Goal status: IMPROVED  3.  Pt will demonstrate appropriate understanding of proper body mechanics with daily activities to minimize pain and strain Baseline:  Goal status: MET  4.  Improve 5xSTS by MCID of 5 as indication of improved functional mobility Baseline: 05/14/24  11.1 06/04/24: 9.6 Goal status: MET: at appropriate level  5.  Pt's Mod Oswestry score will improve by the MCID to 19% as indication  of improved function  Baseline: 32% 06/26/24: 19% Goal status: MET  PLAN:  PT FREQUENCY: 2x/week  PT DURATION: 6 weeks  PLANNED INTERVENTIONS: 97164- PT Re-evaluation, 97110-Therapeutic exercises, 97530- Therapeutic activity,  02887- Neuromuscular re-education, [phone]- Self Care, 02859- Manual therapy, [id number]- Aquatic Therapy, [phone]- Electrical stimulation (unattended), [phone] (1-2 muscles), [id number] (3+ muscles)- Dry Needling, Patient/Family education, Taping, Spinal mobilization, Cryotherapy, and Moist heat.  PLAN FOR NEXT SESSION: Assess 5xSTS; assess response to HEP; progress therex as indicated; use of modalities, manual therapy; and TPDN as indicated.   PHYSICAL THERAPY DISCHARGE SUMMARY  Visits from Start of Care: 10  Current functional level related to goals / functional outcomes: See clinical impression and PT goals    Remaining deficits: See clinical impression and PT goals    Education / Equipment: HEP/Pt Ed   Patient agrees to discharge. Patient goals were partially met. Patient is being discharged due to being pleased with the current functional level.   Enedelia Martorelli MS, PT 06/26/24 11:48 AM

## 2024-06-26 ENCOUNTER — Ambulatory Visit

## 2024-06-26 DIAGNOSIS — M6281 Muscle weakness (generalized): Secondary | ICD-10-CM | POA: Diagnosis present

## 2024-06-26 DIAGNOSIS — G8929 Other chronic pain: Secondary | ICD-10-CM | POA: Diagnosis present

## 2024-06-26 DIAGNOSIS — R262 Difficulty in walking, not elsewhere classified: Secondary | ICD-10-CM | POA: Diagnosis present

## 2024-06-26 DIAGNOSIS — M5441 Lumbago with sciatica, right side: Secondary | ICD-10-CM | POA: Diagnosis present
